# Patient Record
Sex: Male | Born: 1990 | Race: White | Hispanic: No | Marital: Single | Smoking: Current every day smoker
Health system: Southern US, Community
[De-identification: ages and names within clinical notes are randomized; demographics above are authoritative.]

## PROBLEM LIST (undated history)

## (undated) DIAGNOSIS — F329 Major depressive disorder, single episode, unspecified: Secondary | ICD-10-CM

## (undated) DIAGNOSIS — T7840XA Allergy, unspecified, initial encounter: Secondary | ICD-10-CM

## (undated) DIAGNOSIS — F32A Depression, unspecified: Secondary | ICD-10-CM

## (undated) DIAGNOSIS — F419 Anxiety disorder, unspecified: Secondary | ICD-10-CM

## (undated) HISTORY — PX: APPENDECTOMY: SHX54

## (undated) HISTORY — DX: Allergy, unspecified, initial encounter: T78.40XA

## (undated) HISTORY — PX: TONSILLECTOMY AND ADENOIDECTOMY: SUR1326

## (undated) HISTORY — PX: ADENOIDECTOMY: SHX5191

## (undated) HISTORY — DX: Anxiety disorder, unspecified: F41.9

---

## 2013-02-15 ENCOUNTER — Emergency Department (HOSPITAL_COMMUNITY): Payer: Federal, State, Local not specified - PPO

## 2013-02-15 ENCOUNTER — Encounter (HOSPITAL_COMMUNITY): Payer: Self-pay | Admitting: *Deleted

## 2013-02-15 ENCOUNTER — Emergency Department (HOSPITAL_COMMUNITY)
Admission: EM | Admit: 2013-02-15 | Discharge: 2013-02-15 | Disposition: A | Discharge status: Home / Self Care | Payer: Federal, State, Local not specified - PPO | Attending: Emergency Medicine | Admitting: Emergency Medicine

## 2013-02-15 DIAGNOSIS — Z8659 Personal history of other mental and behavioral disorders: Secondary | ICD-10-CM | POA: Insufficient documentation

## 2013-02-15 DIAGNOSIS — Z9089 Acquired absence of other organs: Secondary | ICD-10-CM | POA: Insufficient documentation

## 2013-02-15 DIAGNOSIS — R059 Cough, unspecified: Secondary | ICD-10-CM | POA: Insufficient documentation

## 2013-02-15 DIAGNOSIS — J029 Acute pharyngitis, unspecified: Secondary | ICD-10-CM

## 2013-02-15 DIAGNOSIS — R05 Cough: Secondary | ICD-10-CM | POA: Insufficient documentation

## 2013-02-15 DIAGNOSIS — F17200 Nicotine dependence, unspecified, uncomplicated: Secondary | ICD-10-CM | POA: Insufficient documentation

## 2013-02-15 DIAGNOSIS — J069 Acute upper respiratory infection, unspecified: Secondary | ICD-10-CM

## 2013-02-15 HISTORY — DX: Major depressive disorder, single episode, unspecified: F32.9

## 2013-02-15 HISTORY — DX: Depression, unspecified: F32.A

## 2013-02-15 LAB — RAPID STREP SCREEN (MED CTR MEBANE ONLY): Streptococcus, Group A Screen (Direct): NEGATIVE

## 2013-02-15 MED ORDER — ACETAMINOPHEN-CODEINE 120-12 MG/5ML PO SOLN: 10.0000 mL | ORAL | Status: DC | PRN

## 2013-02-15 MED ORDER — GUAIFENESIN ER 1200 MG PO TB12: 1.0000 | ORAL_TABLET | Freq: Two times a day (BID) | ORAL | Status: DC

## 2013-02-15 MED ORDER — IBUPROFEN 800 MG PO TABS: 800.0000 mg | ORAL_TABLET | Freq: Three times a day (TID) | ORAL | Status: DC | PRN

## 2013-02-15 NOTE — ED Notes (Signed)
Patient states that 3 days ago throat began to get very sore. Patient states he has pain with eating and drinking. He has been taking ibuprofen for pain relief but symptom persist. No coughing associated with sore throat. Patient reports sinus drainage down his throat.

## 2013-02-15 NOTE — ED Provider Notes (Signed)
CSN: [id number]     Arrival date & time 02/15/13  2027 History   First MD Initiated Contact with Patient 02/15/13 2111     Chief Complaint  Patient presents with  . Sore Throat   (Consider location/radiation/quality/duration/timing/severity/associated sxs/prior Treatment) HPI Patient presents emergency department with sore throat, cough, runny nose for the last 3 days.  Patient, states, that he's had some postnasal drip, as well.  Patient denies chest pain, shortness of breath, nausea, vomiting, abdominal pain, headache, blurred vision, weakness, numbness, fever, neck pain, back pain, dysuria, or syncope.  Patient, states, that he take ibuprofen for his discomfort.  Patient, states nothing seems to make his condition worse Past Medical History  Diagnosis Date  . Depression    Past Surgical History  Procedure Laterality Date  . Appendectomy    . Tonsillectomy and adenoidectomy Bilateral   . Adenoidectomy Bilateral    No family history on file. History  Substance Use Topics  . Smoking status: Current Every Day Smoker  . Smokeless tobacco: Not on file  . 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 All pertinent positives and negatives as reviewed in the HPI. Allergies  Aspirin and Compazine  Home Medications   Current Outpatient Rx  Name  Route  Sig  Dispense  Refill  . ibuprofen (ADVIL,MOTRIN) 200 MG tablet   Oral   Take 800 mg by mouth every 6 (six) hours as needed for pain.          BP 132/71  Pulse 96  Temp(Src) 98.5 F (36.9 C) (Oral)  Resp 18  SpO2 99% Physical Exam  Nursing note and vitals reviewed. Constitutional: He is oriented to person, place, and time. He appears well-developed and well-nourished. No distress.  HENT:  Head: Normocephalic and atraumatic.  Nose: Nose normal.  Mouth/Throat: No oropharyngeal exudate.  Eyes: Pupils are  equal, round, and reactive to light.  Neck: Normal range of motion. Neck supple.  Cardiovascular: Normal rate, regular rhythm and normal heart sounds.  Exam reveals no gallop and no friction rub.   No murmur heard. Pulmonary/Chest: Effort normal and breath sounds normal. No respiratory distress. He has no wheezes.  Neurological: He is alert and oriented to person, place, and time. He exhibits normal muscle tone. Coordination normal.  Skin: Skin is warm and dry. No erythema.    ED Course  Procedures (including critical care time) Patient be treated for viral URI symptoms told to return here as needed.  MDM      Carlyle Dolly, PA-C 02/15/13 2237

## 2013-02-16 NOTE — ED Provider Notes (Signed)
Medical screening examination/treatment/procedure(s) were performed by non-physician practitioner and as supervising physician I was immediately available for consultation/collaboration.  Lorie Melichar M Abbygail [place], MD 02/16/13 1327 

## 2013-02-17 LAB — CULTURE, GROUP A STREP

## 2013-12-29 ENCOUNTER — Encounter (HOSPITAL_COMMUNITY): Payer: Self-pay | Admitting: Emergency Medicine

## 2013-12-29 ENCOUNTER — Emergency Department (HOSPITAL_COMMUNITY)
Admission: EM | Admit: 2013-12-29 | Discharge: 2013-12-29 | Disposition: A | Discharge status: Home / Self Care | Payer: Federal, State, Local not specified - PPO | Attending: Emergency Medicine | Admitting: Emergency Medicine

## 2013-12-29 DIAGNOSIS — F3289 Other specified depressive episodes: Secondary | ICD-10-CM | POA: Insufficient documentation

## 2013-12-29 DIAGNOSIS — F32A Depression, unspecified: Secondary | ICD-10-CM

## 2013-12-29 DIAGNOSIS — F411 Generalized anxiety disorder: Secondary | ICD-10-CM | POA: Insufficient documentation

## 2013-12-29 DIAGNOSIS — F419 Anxiety disorder, unspecified: Secondary | ICD-10-CM

## 2013-12-29 DIAGNOSIS — F329 Major depressive disorder, single episode, unspecified: Secondary | ICD-10-CM | POA: Insufficient documentation

## 2013-12-29 DIAGNOSIS — F17200 Nicotine dependence, unspecified, uncomplicated: Secondary | ICD-10-CM | POA: Insufficient documentation

## 2013-12-29 DIAGNOSIS — F332 Major depressive disorder, recurrent severe without psychotic features: Secondary | ICD-10-CM

## 2013-12-29 DIAGNOSIS — R45851 Suicidal ideations: Secondary | ICD-10-CM | POA: Insufficient documentation

## 2013-12-29 DIAGNOSIS — Z79899 Other long term (current) drug therapy: Secondary | ICD-10-CM | POA: Insufficient documentation

## 2013-12-29 LAB — URINALYSIS, ROUTINE W REFLEX MICROSCOPIC
Bilirubin Urine: NEGATIVE
Glucose, UA: NEGATIVE mg/dL
Ketones, ur: NEGATIVE mg/dL
LEUKOCYTES UA: NEGATIVE
NITRITE: NEGATIVE
PH: 7.5 (ref 5.0–8.0)
Protein, ur: NEGATIVE mg/dL
SPECIFIC GRAVITY, URINE: 1.011 (ref 1.005–1.030)
UROBILINOGEN UA: 0.2 mg/dL (ref 0.0–1.0)

## 2013-12-29 LAB — CBC WITH DIFFERENTIAL/PLATELET
BASOS PCT: 0 % (ref 0–1)
Basophils Absolute: 0 10*3/uL (ref 0.0–0.1)
Eosinophils Absolute: 0.1 10*3/uL (ref 0.0–0.7)
Eosinophils Relative: 1 % (ref 0–5)
HEMATOCRIT: 47.8 % (ref 39.0–52.0)
HEMOGLOBIN: 16.7 g/dL (ref 13.0–17.0)
Lymphocytes Relative: 9 % — ABNORMAL LOW (ref 12–46)
Lymphs Abs: 1.4 10*3/uL (ref 0.7–4.0)
MCH: 31.1 pg (ref 26.0–34.0)
MCHC: 34.9 g/dL (ref 30.0–36.0)
MCV: 89 fL (ref 78.0–100.0)
MONOS PCT: 6 % (ref 3–12)
Monocytes Absolute: 0.9 10*3/uL (ref 0.1–1.0)
NEUTROS ABS: 13.1 10*3/uL — AB (ref 1.7–7.7)
Neutrophils Relative %: 84 % — ABNORMAL HIGH (ref 43–77)
Platelets: 197 10*3/uL (ref 150–400)
RBC: 5.37 MIL/uL (ref 4.22–5.81)
RDW: 12.6 % (ref 11.5–15.5)
WBC: 15.6 10*3/uL — ABNORMAL HIGH (ref 4.0–10.5)

## 2013-12-29 LAB — BASIC METABOLIC PANEL
Anion gap: 12 (ref 5–15)
BUN: 15 mg/dL (ref 6–23)
CHLORIDE: 106 meq/L (ref 96–112)
CO2: 26 mEq/L (ref 19–32)
Calcium: 10.3 mg/dL (ref 8.4–10.5)
Creatinine, Ser: 1.03 mg/dL (ref 0.50–1.35)
GFR calc Af Amer: >90 mL/min (ref >90)
GFR calc non Af Amer: >90 mL/min (ref >90)
GLUCOSE: 89 mg/dL (ref 70–99)
POTASSIUM: 4.8 meq/L (ref 3.7–5.3)
Sodium: 144 mEq/L (ref 137–147)

## 2013-12-29 LAB — ACETAMINOPHEN LEVEL

## 2013-12-29 LAB — SALICYLATE LEVEL

## 2013-12-29 LAB — RAPID URINE DRUG SCREEN, HOSP PERFORMED
Amphetamines: NOT DETECTED
BENZODIAZEPINES: NOT DETECTED
Barbiturates: NOT DETECTED
COCAINE: NOT DETECTED
OPIATES: NOT DETECTED
Tetrahydrocannabinol: POSITIVE — AB

## 2013-12-29 LAB — URINE MICROSCOPIC-ADD ON

## 2013-12-29 LAB — ETHANOL: Alcohol, Ethyl (B): <11 mg/dL (ref 0–11)

## 2013-12-29 MED ORDER — FLUOXETINE HCL 40 MG PO CAPS: 40.0000 mg | ORAL_CAPSULE | Freq: Every day | ORAL | Status: DC

## 2013-12-29 MED ORDER — ZOLPIDEM TARTRATE 5 MG PO TABS: 5.0000 mg | ORAL_TABLET | Freq: Every evening | ORAL | Status: DC | PRN

## 2013-12-29 MED ORDER — LORAZEPAM 1 MG PO TABS: 1.0000 mg | ORAL_TABLET | Freq: Three times a day (TID) | ORAL | Status: DC | PRN

## 2013-12-29 MED ORDER — NICOTINE 21 MG/24HR TD PT24
21.0000 mg | MEDICATED_PATCH | Freq: Every day | TRANSDERMAL | Status: DC
  Administered 2013-12-29: 21 mg via TRANSDERMAL
  Filled 2013-12-29: qty 1

## 2013-12-29 MED ORDER — ACETAMINOPHEN 325 MG PO TABS
650.0000 mg | ORAL_TABLET | ORAL | Status: DC | PRN
  Administered 2013-12-29: 650 mg via ORAL
  Filled 2013-12-29: qty 2

## 2013-12-29 MED ORDER — ONDANSETRON HCL 4 MG PO TABS: 4.0000 mg | ORAL_TABLET | Freq: Three times a day (TID) | ORAL | Status: DC | PRN

## 2013-12-29 MED ORDER — ALUM & MAG HYDROXIDE-SIMETH 200-200-20 MG/5ML PO SUSP: 30.0000 mL | ORAL | Status: DC | PRN
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
## 2013-12-29 NOTE — [facility] Suicide Risk Assessment (Cosign Needed)
Suicide Risk Assessment  Discharge Assessment     Demographic Factors:  Male, Adolescent or young adult, Caucasian and Low socioeconomic status  Total Time spent with patient: 30 minutes  Psychiatric Specialty Exam:     Blood pressure 120/68, pulse 74, temperature 98.3 F (36.8 C), temperature source Oral, resp. rate 18, SpO2 100.00%.There is no height or weight on file to calculate BMI.  General Appearance: Casual and Disheveled  Eye Contact::  Good  Speech:  Clear and Coherent and Normal Rate  Volume:  Normal  Mood:  Anxious and Depressed  Affect:  Congruent, Depressed and Flat  Thought Process:  Coherent, Goal Directed and Intact  Orientation:  Full (Time, Place, and Person)  Thought Content:  WDL  Suicidal Thoughts:  No  Homicidal Thoughts:  No  Memory:  Immediate;   Good Recent;   Good Remote;   Good  Judgement:  Poor  Insight:  Good  Psychomotor Activity:  Normal  Concentration:  Good  Recall:  NA  Fund of Knowledge:Good  Language: Good  Akathisia:  NA  Handed:  Right  AIMS (if indicated):     Assets:  Desire for Improvement  Sleep:       Musculoskeletal: Strength & Muscle Tone: within normal limits Gait & Station: normal Patient leans: N/A   Mental Status Per Nursing Assessment::   On Admission:     Current Mental Status by Physician: NA  Loss Factors: NA  Historical Factors: Prior suicide attempts  Risk Reduction Factors:   Employed, Living with another person, especially a relative and Positive therapeutic relationship  Continued Clinical Symptoms:  Depression:   Insomnia  Cognitive Features That Contribute To Risk:  Polarized thinking    Suicide Risk:  Minimal: No identifiable suicidal ideation.  Patients presenting with no risk factors but with morbid ruminations; may be classified as minimal risk based on the severity of the depressive symptoms  Discharge Diagnoses:   AXIS I:  Major Depression, Recurrent severe AXIS II:   Borderline Personality Dis. and Personality Disorder NOS AXIS III:   Past Medical History  Diagnosis Date  . Depression    AXIS IV:  other psychosocial or environmental problems and problems related to social environment AXIS V:  51-60 moderate symptoms  Plan Of Care/Follow-up recommendations:  Activity:  As tolerated Diet:  Regular  Is patient on multiple antipsychotic therapies at discharge:  No   Has Patient had three or more failed trials of antipsychotic monotherapy by history:  No  Recommended Plan for Multiple Antipsychotic Therapies: NA    Dahlia ByesONUOHA, Raedyn Klinck, C   PMHNP-BC 12/29/2013, 2:18 PM

## 2013-12-29 NOTE — ED Notes (Signed)
Bed: [id number] Expected date:  Expected time:  Means of arrival:  Comments: Pt is room

## 2013-12-29 NOTE — ED Notes (Signed)
Pt discharged pt per MD order. Pt explained discharge instructions. Pt denies SI HI AVH Pain. Pt encouraged to call provider for needs. Pt belongings form signed and all belongings returned. No acute distress noted.

## 2013-12-29 NOTE — ED Notes (Signed)
TTS in room.  

## 2013-12-29 NOTE — ED Notes (Signed)
Pt. To [facility] from ED ambulatory without difficulty. Report from Dollar GeneralKelly RN. Pt. Is alert and oriented, warm and dry in no distress. Pt. Denies SI, HI, and AVH. Pt. Calm and cooperative. Pt. Encouraged to let Nursing staff know if he has concerns or needs.
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
## 2013-12-29 NOTE — BH Assessment (Signed)
Consulted with Dr. Norlene Campbell before assessing the PT.  Spoke with Dr. Norlene Campbell after completing assessment about PT's disposition.
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
## 2013-12-29 NOTE — ED Notes (Signed)
Dr J and josephine NP into see 
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
## 2013-12-29 NOTE — ED Notes (Signed)
[id number] Expected date:  Expected time:  Means of arrival:  Comments: Rm 14

## 2013-12-29 NOTE — ED Notes (Signed)
Up to the desk on the phone 

## 2013-12-29 NOTE — ED Notes (Signed)
Pt has been given scrubs to change into.  

## 2013-12-29 NOTE — ED Notes (Signed)
Josephiine np into see

## 2013-12-29 NOTE — ED Notes (Signed)
Up to the bathroom 
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
## 2014-01-13 ENCOUNTER — Ambulatory Visit (INDEPENDENT_AMBULATORY_CARE_PROVIDER_SITE_OTHER): Payer: BC Managed Care – PPO | Admitting: Family Medicine

## 2014-01-13 VITALS — BP 122/76 | HR 68 | Temp 98.3°F | Resp 16 | Ht 68.75 in | Wt 154.6 lb

## 2014-01-13 DIAGNOSIS — F32A Depression, unspecified: Secondary | ICD-10-CM

## 2014-01-13 DIAGNOSIS — F3289 Other specified depressive episodes: Secondary | ICD-10-CM

## 2014-01-13 DIAGNOSIS — F411 Generalized anxiety disorder: Secondary | ICD-10-CM

## 2014-01-13 DIAGNOSIS — F329 Major depressive disorder, single episode, unspecified: Secondary | ICD-10-CM

## 2014-01-13 MED ORDER — HYDROXYZINE HCL 25 MG PO TABS: 12.5000 mg | ORAL_TABLET | Freq: Three times a day (TID) | ORAL | Status: DC | PRN

## 2014-01-13 MED ORDER — FLUOXETINE HCL 40 MG PO CAPS: 40.0000 mg | ORAL_CAPSULE | Freq: Every day | ORAL | Status: DC

## 2014-01-13 NOTE — Progress Notes (Signed)
Subjective:    Patient ID: Ralph Cruz, male    DOB: January 05, 1991, 22 y.o.   MRN: [id number]  HPI This is a pleasant 23 yo male who is accompanied by his girl firend. Patient presents today for follow up of his recent ER visit. He was seen 12/29/13 and treated for depression and anxiety. He was started on fluoxetine which he thinks has greatly reduced his depression. He is here today for refill and to discuss his anxiety. He is having 3-5 panic attacks a week, due to minor stressors. He feels like his response is not in proportion to the stress. He feels his heart race and feels anxious. These attacks last for up to 5 minutes.   He had been diagnosed with anxiety/depression several years ago and his symptoms calmed down after puberty until recent stressors triggered his anxiety and depression. He saw Lala Lund this spring for counseling until he had a problem with his insurance. He found the counseling helpful and has an appointment with her next week.  Is not sleeping well. Has insomnia off and on. Has had difficulty falling asleep, this has been better over the last several nights. 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 He does not get any exercise outside of work. He is pursuing nursing school.  Past Medical History  Diagnosis Date  . Depression   . Allergy   . Anxiety    Past Surgical History  Procedure Laterality Date  . Appendectomy    . Tonsillectomy and adenoidectomy Bilateral   . Adenoidectomy Bilateral    Family History  Problem Relation Age of Onset  . Diabetes Mother   . Mental illness Mother   . Mental illness Father   . Mental illness Maternal Grandmother   . Mental illness Maternal Grandfather    History  Substance Use Topics  . Smoking status: Current Every Day Smoker  . Smokeless tobacco: Not on file  . 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 Constitutional: He is oriented to person, place, and time. He appears well-developed and well-nourished. No distress.  HENT:  Head: Normocephalic and atraumatic.  Right Ear: Tympanic membrane, external ear and ear canal normal.  Left Ear: Tympanic membrane, external ear and ear canal normal.  Nose: Nose normal.  Mouth/Throat: Oropharynx is clear and moist. No oropharyngeal exudate.  Eyes: Conjunctivae and EOM are normal. Pupils are equal, round, and reactive to light.  Neck: Normal range of motion. Neck supple.  Cardiovascular: Normal rate, regular rhythm and normal heart sounds.   Pulmonary/Chest: Effort normal and breath sounds normal.  Musculoskeletal: Normal range of motion.  Lymphadenopathy:    He has no cervical adenopathy.  Neurological: He is alert and oriented to person, place, and time.  Skin: Skin is warm and dry. He is not diaphoretic.  Psychiatric: He has a normal mood and affect. His behavior is normal. Judgment and thought content normal.      Assessment & Plan:  1. Depression -Since fluoxetine is working well for him, will continue current dose.  - FLUoxetine (PROZAC) 40 MG capsule; Take 1 capsule (40 mg total) by mouth daily.  Dispense: 30 capsule; Refill: 2  2. Anxiety state, unspecified -Hydroxyzine 25 mg 1/2- 1  tablet every 8 hours prn anxiety  - Discussed stress reduction, sleep hygiene, avoiding self medicating with ETOH/ilicit drugs. -Follow up next week as scheduled with counselor -RTC in 8-12 weeks for follow up, sooner if worsening symptoms   Emi Belfast, FNP-BC  [facility], [facility]  01/15/2014 9:03 PM

## 2015-07-15 ENCOUNTER — Ambulatory Visit (INDEPENDENT_AMBULATORY_CARE_PROVIDER_SITE_OTHER): Payer: Commercial Managed Care - HMO | Admitting: Physician Assistant

## 2015-07-15 DIAGNOSIS — F329 Major depressive disorder, single episode, unspecified: Secondary | ICD-10-CM | POA: Diagnosis not present

## 2015-07-15 DIAGNOSIS — F411 Generalized anxiety disorder: Secondary | ICD-10-CM | POA: Diagnosis not present

## 2015-07-15 DIAGNOSIS — F32A Depression, unspecified: Secondary | ICD-10-CM

## 2015-07-15 MED ORDER — ALPRAZOLAM 0.25 MG PO TABS: 0.2500 mg | ORAL_TABLET | Freq: Every day | ORAL | Status: DC | PRN

## 2015-07-15 MED ORDER — HYDROXYZINE HCL 25 MG PO TABS: 12.5000 mg | ORAL_TABLET | Freq: Three times a day (TID) | ORAL | Status: AC | PRN

## 2015-07-15 MED ORDER — FLUOXETINE HCL 20 MG PO TABS: 20.0000 mg | ORAL_TABLET | Freq: Every day | ORAL | Status: DC

## 2015-07-15 NOTE — Patient Instructions (Addendum)
Please continue to see the counselor twice per month.  I would like to see you able to thrive without the use of the xanax, where the medicine is enough.  Take only what is prescribed.   Please return in 4-6 weeks for follow up.  We will then raise the dose of the Prozac, but it needs to get into the system first.  Alprazolam tablets What is this medicine? ALPRAZOLAM (al PRAY zoe lam) is a benzodiazepine. It is used to treat anxiety and panic attacks. This medicine may be used for other purposes; ask your health care provider or pharmacist if you have questions. What should I tell my health care provider before I take this medicine? They need to know if you have any of these conditions: -an alcohol or drug abuse problem -bipolar disorder, depression, psychosis or other mental health conditions -glaucoma -kidney or liver disease -lung or breathing disease -myasthenia gravis -Parkinson's disease -porphyria -seizures or a history of seizures -suicidal thoughts -an unusual or allergic reaction to alprazolam, other benzodiazepines, foods, dyes, or preservatives -pregnant or trying to get pregnant -breast-feeding How should I use this medicine? Take this medicine by mouth with a glass of water. Follow the directions on the prescription label. Take your medicine at regular intervals. Do not take it more often than directed. If you have been taking this medicine regularly for some time, do not suddenly stop taking it. You must gradually reduce the dose or you may get severe side effects. Ask your doctor or health care professional for advice. Even after you stop taking this medicine it can still affect your body for several days. Talk to your pediatrician regarding the use of this medicine in children. Special care may be needed. Overdosage: If you think you have taken too much of this medicine contact a poison control center or emergency room at once. NOTE: This medicine is only for you. Do not  share this medicine with others. What if I miss a dose? If you miss a dose, take it as soon as you can. If it is almost time for your next dose, take only that dose. Do not take double or extra doses. What may interact with this medicine? Do not take this medicine with any of the following medications: -certain medicines for HIV infection or AIDS -ketoconazole -itraconazole This medicine may also interact with the following medications: -birth control pills -certain macrolide antibiotics like clarithromycin, erythromycin, troleandomycin -cimetidine -cyclosporine -ergotamine -grapefruit juice -herbal or dietary supplements like kava kava, melatonin, dehydroepiandrosterone, DHEA, St. John's Wort or valerian -imatinib, STI-571 -isoniazid -levodopa -medicines for depression, anxiety, or psychotic disturbances -prescription pain medicines -rifampin, rifapentine, or rifabutin -some medicines for blood pressure or heart problems -some medicines for seizures like carbamazepine, oxcarbazepine, phenobarbital, phenytoin, primidone This list may not describe all possible interactions. Give your health care provider a list of all the medicines, herbs, non-prescription drugs, or dietary supplements you use. Also tell them if you smoke, drink alcohol, or use illegal drugs. Some items may interact with your medicine. What should I watch for while using this medicine? Visit your doctor or health care professional for regular checks on your progress. Your body can become dependent on this medicine. Ask your doctor or health care professional if you still need to take it. You may get drowsy or dizzy. Do not drive, use machinery, or do anything that needs mental alertness until you know how this medicine affects you. To reduce the risk of dizzy and fainting spells, do  not stand or sit up quickly, especially if you are an older patient. Alcohol may increase dizziness and drowsiness. Avoid alcoholic drinks. Do  not treat yourself for coughs, colds or allergies without asking your doctor or health care professional for advice. Some ingredients can increase possible side effects. What side effects may I notice from receiving this medicine? Side effects that you should report to your doctor or health care professional as soon as possible: -allergic reactions like skin rash, itching or hives, swelling of the face, lips, or tongue -confusion, forgetfulness -depression -difficulty sleeping -difficulty speaking -feeling faint or lightheaded, falls -mood changes, excitability or aggressive behavior -muscle cramps -trouble passing urine or change in the amount of urine -unusually weak or tired Side effects that usually do not require medical attention (report to your doctor or health care professional if they continue or are bothersome): -change in sex drive or performance -changes in appetite This list may not describe all possible side effects. Call your doctor for medical advice about side effects. You may report side effects to FDA at 1-800-FDA-1088. Where should I keep my medicine? Keep out of the reach of children. This medicine can be abused. Keep your medicine in a safe place to protect it from theft. Do not share this medicine with anyone. Selling or giving away this medicine is dangerous and against the law. Store at room temperature between 20 and 25 degrees C (68 and 77 degrees F). This medicine may cause accidental overdose and death if taken by other adults, children, or pets. Mix any unused medicine with a substance like cat litter or coffee grounds. Then throw the medicine away in a sealed container like a sealed bag or a coffee can with a lid. Do not use the medicine after the expiration date. NOTE: This sheet is a summary. It may not cover all possible information. If you have questions about this medicine, talk to your doctor, pharmacist, or health care provider.    2016, Elsevier/Gold  Standard. (2014-01-21 14:51:36)    Fluoxetine capsules or tablets (Depression/Mood Disorders) What is this medicine? FLUOXETINE (floo OX e teen) belongs to a class of drugs known as selective serotonin reuptake inhibitors (SSRIs). It helps to treat mood problems such as depression, obsessive compulsive disorder, and panic attacks. It can also treat certain eating disorders. This medicine may be used for other purposes; ask your health care provider or pharmacist if you have questions. What should I tell my health care provider before I take this medicine? They need to know if you have any of these conditions: -bipolar disorder or mania -diabetes -glaucoma -liver disease -psychosis -seizures -suicidal thoughts or history of attempted suicide -an unusual or allergic reaction to fluoxetine, other medicines, foods, dyes, or preservatives -pregnant or trying to get pregnant -breast-feeding How should I use this medicine? Take this medicine by mouth with a glass of water. Follow the directions on the prescription label. You can take this medicine with or without food. Take your medicine at regular intervals. Do not take it more often than directed. Do not stop taking this medicine suddenly except upon the advice of your doctor. Stopping this medicine too quickly may cause serious side effects or your condition may worsen. A special MedGuide will be given to you by the pharmacist with each prescription and refill. Be sure to read this information carefully each time. Talk to your pediatrician regarding the use of this medicine in children. While this drug may be prescribed for children as young as  7 years for selected conditions, precautions do apply. Overdosage: If you think you have taken too much of this medicine contact a poison control center or emergency room at once. NOTE: This medicine is only for you. Do not share this medicine with others. What if I miss a dose? If you miss a dose, skip  the missed dose and go back to your regular dosing schedule. Do not take double or extra doses. What may interact with this medicine? Do not take fluoxetine with any of the following medications: -other medicines containing fluoxetine, like Sarafem or Symbyax -cisapride -linezolid -MAOIs like Carbex, Eldepryl, Marplan, Nardil, and Parnate -methylene blue (injected into a vein) -pimozide -thioridazine This medicine may also interact with the following medications: -alcohol -aspirin and aspirin-like medicines -carbamazepine -certain medicines for depression, anxiety, or psychotic disturbances -certain medicines for migraine headaches like almotriptan, eletriptan, frovatriptan, naratriptan, rizatriptan, sumatriptan, zolmitriptan -digoxin -diuretics -fentanyl -flecainide -furazolidone -isoniazid -lithium -medicines for sleep -medicines that treat or prevent blood clots like warfarin, enoxaparin, and dalteparin -NSAIDs, medicines for pain and inflammation, like ibuprofen or naproxen -phenytoin -procarbazine -propafenone -rasagiline -ritonavir -supplements like St. John's wort, kava kava, valerian -tramadol -tryptophan -vinblastine This list may not describe all possible interactions. Give your health care provider a list of all the medicines, herbs, non-prescription drugs, or dietary supplements you use. Also tell them if you smoke, drink alcohol, or use illegal drugs. Some items may interact with your medicine. What should I watch for while using this medicine? Tell your doctor if your symptoms do not get better or if they get worse. Visit your doctor or health care professional for regular checks on your progress. Because it may take several weeks to see the full effects of this medicine, it is important to continue your treatment as prescribed by your doctor. Patients and their families should watch out for new or worsening thoughts of suicide or depression. Also watch out for  sudden changes in feelings such as feeling anxious, agitated, panicky, irritable, hostile, aggressive, impulsive, severely restless, overly excited and hyperactive, or not being able to sleep. If this happens, especially at the beginning of treatment or after a change in dose, call your health care professional. Bonita Quin may get drowsy or dizzy. Do not drive, use machinery, or do anything that needs mental alertness until you know how this medicine affects you. Do not stand or sit up quickly, especially if you are an older patient. This reduces the risk of dizzy or fainting spells. Alcohol may interfere with the effect of this medicine. Avoid alcoholic drinks. Your mouth may get dry. Chewing sugarless gum or sucking hard candy, and drinking plenty of water may help. Contact your doctor if the problem does not go away or is severe. This medicine may affect blood sugar levels. If you have diabetes, check with your doctor or health care professional before you change your diet or the dose of your diabetic medicine. What side effects may I notice from receiving this medicine? Side effects that you should report to your doctor or health care professional as soon as possible: -allergic reactions like skin rash, itching or hives, swelling of the face, lips, or tongue -breathing problems -confusion -eye pain, changes in vision -fast or irregular heart rate, palpitations -flu-like fever, chills, cough, muscle or joint aches and pains -seizures -suicidal thoughts or other mood changes -swelling or redness in or around the eye -tremors -trouble sleeping -unusual bleeding or bruising -unusually tired or weak -vomiting Side effects that usually do not  require medical attention (report to your doctor or health care professional if they continue or are bothersome): -change in sex drive or performance -diarrhea -dry mouth -flushing -headache -increased or decreased appetite -nausea -sweating This list may  not describe all possible side effects. Call your doctor for medical advice about side effects. You may report side effects to FDA at 1-800-FDA-1088. Where should I keep my medicine? Keep out of the reach of children. Store at room temperature between 15 and 30 degrees C (59 and 86 degrees F). Throw away any unused medicine after the expiration date. NOTE: This sheet is a summary. It may not cover all possible information. If you have questions about this medicine, talk to your doctor, pharmacist, or health care provider.    2016, Elsevier/Gold Standard. (2014-04-25 12:40:07)

## 2015-07-15 NOTE — Progress Notes (Signed)
[facility] [address], [place] [address] [phone]  Date:  07/15/2015   Name:  Ralph Cruz   DOB:  1991/03/10   MRN:  [id number]  PCP:  No PCP Per Patient    History of Present Illness:  Ralph Cruz is a 25 y.o. male patient who presents to [facility]    His insurance lapsed.      There are no active problems to display for this patient.   Past Medical History  Diagnosis Date  . Depression   . Allergy   . Anxiety     Past Surgical History  Procedure Laterality Date  . Appendectomy    . Tonsillectomy and adenoidectomy Bilateral   . Adenoidectomy Bilateral     Social History  Substance Use Topics  . Smoking status: Current Every Day Smoker  . Smokeless tobacco: None  . 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 Diabetes Mother   . Mental illness Mother   . Mental illness Father   . Mental illness Maternal Grandmother   . Mental illness Maternal Grandfather     Allergies  Allergen Reactions  . Aspirin Swelling  . Compazine [Prochlorperazine] Other (See Comments)    "Neurological"     Medication list has been reviewed and updated.  Current Outpatient Prescriptions on File Prior to Visit  Medication Sig Dispense Refill  . FLUoxetine (PROZAC) 40 MG capsule Take 1 capsule (40 mg total) by mouth daily. (Patient not taking: Reported on 07/15/2015) 30 capsule 2  . hydrOXYzine (ATARAX/VISTARIL) 25 MG tablet Take 0.5-1 tablets (12.5-25 mg total) by mouth every 8 (eight) hours as needed for anxiety. (Patient not taking: Reported on 07/15/2015) 30 tablet 1   No current facility-administered medications on file prior to visit.    ROS ROS otherwise unremarkable unless listed above.   Physical Examination: There were no vitals taken for this visit. Ideal Body Weight:    Physical Exam   Assessment and Plan: Ralph Cruz is a 25 y.o. male  who is here today   Depression - Plan: FLUoxetine (PROZAC) 20 MG tablet, hydrOXYzine (ATARAX/VISTARIL) 25 MG tablet  Generalized anxiety disorder - Plan: hydrOXYzine (ATARAX/VISTARIL) 25 MG tablet, ALPRAZolam (XANAX) 0.25 MG tablet, DISCONTINUED: ALPRAZolam Prudy Feeler) 0.25 MG tablet  Trena Platt, PA-C [facility] 3/30/20174:27 PM  Trena Platt, PA-C [facility] 07/15/2015 6:56 PM

## 2015-09-01 ENCOUNTER — Ambulatory Visit (INDEPENDENT_AMBULATORY_CARE_PROVIDER_SITE_OTHER): Payer: Commercial Managed Care - HMO | Admitting: Physician Assistant

## 2015-09-01 VITALS — BP 112/78 | HR 54 | Temp 98.0°F | Resp 16 | Ht 68.75 in | Wt 155.2 lb

## 2015-09-01 DIAGNOSIS — F32A Depression, unspecified: Secondary | ICD-10-CM

## 2015-09-01 DIAGNOSIS — F411 Generalized anxiety disorder: Secondary | ICD-10-CM | POA: Diagnosis not present

## 2015-09-01 DIAGNOSIS — F329 Major depressive disorder, single episode, unspecified: Secondary | ICD-10-CM

## 2015-09-01 MED ORDER — ALPRAZOLAM 0.25 MG PO TABS: 0.2500 mg | ORAL_TABLET | Freq: Every day | ORAL | Status: AC | PRN

## 2015-09-01 MED ORDER — FLUOXETINE HCL 20 MG PO TABS: 40.0000 mg | ORAL_TABLET | Freq: Every day | ORAL | Status: DC

## 2015-09-01 NOTE — Progress Notes (Signed)
[facility] [address], [place] [id number] [phone]  Date:  09/01/2015   Name:  Ralph Cruz   DOB:  03/31/1991   MRN:  [id number]  PCP:  No PCP Per Patient    History of Present Illness:  Ralph Cruz is a 25 y.o. male patient who [facility] to [facility] for follow up of anxiety and depression.  Patient states that he is doing much better.  He has had noticeable improvement on the fluoxetine.  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  He also reports that he had one moment, 2 weeks into start of prozac, where he felt as if he may self-harm.  He is also taking the xanax 3-4 times per week.  He reports that he does like to have it on him when he feels highly anxious.  He feels more like himself without the haze of an anti-depressant.  His girlfriend has noticed that he seems much more calm and less agitable.   He has increased the prozac over the last few weeks to 40 mg daily.  He ran out about 3 days before.  He reports side effect of nausea with the prozac initially, but this has resolved.     There are no active problems to display for this patient.   Past Medical History  Diagnosis Date  . Depression   . Allergy   . Anxiety     Past Surgical History  Procedure Laterality Date  . Appendectomy    . Tonsillectomy and adenoidectomy Bilateral   . Adenoidectomy Bilateral     Social History  Substance Use Topics  . Smoking status: Current Every Day Smoker  . Smokeless tobacco: None  . 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 Diabetes Mother   . Mental illness Mother   . Mental illness Father   . Mental illness Maternal Grandmother   . Mental illness Maternal Grandfather     Allergies  Allergen Reactions  . Aspirin Swelling  . Compazine [Prochlorperazine] Other (See Comments)    "Neurological"      Medication list has been reviewed and updated.  Current Outpatient Prescriptions on File Prior to Visit  Medication Sig Dispense Refill  . hydrOXYzine (ATARAX/VISTARIL) 25 MG tablet Take 0.5-1 tablets (12.5-25 mg total) by mouth every 8 (eight) hours as needed for anxiety. 30 tablet 1   No current facility-administered medications on file prior to visit.    ROS ROS otherwise unremarkable unless listed above.   Physical Examination: BP 112/78 mmHg  Pulse 54  Temp(Src) 98 F (36.7 C) (Oral)  Resp 16  Ht 5' 8.75" (1.746 m)  Wt 155 lb 3.2 oz (70.398 kg)  BMI 23.09 kg/m2  SpO2 98% Ideal Body Weight: Weight in (lb) to have BMI = 25: 167.7  Physical Exam  Constitutional: He is oriented to person, place, and time. He appears well-developed and well-nourished. No distress.  HENT:  Head: Normocephalic and atraumatic.  Eyes: Conjunctivae and EOM are normal. Pupils are equal, round, and reactive to light.  Cardiovascular: Normal rate.   Pulmonary/Chest: Effort normal. No respiratory distress.  Neurological: He is alert and oriented to person, place, and time.  Skin: Skin is warm and dry. He is not diaphoretic.  Psychiatric: He has a normal mood and affect. His  behavior is normal.     Assessment and Plan: Ralph Cruz is a 25 y.o. male who is here today for follow up of depression and anxiety. The prozac is improving his symptoms at this time.  We will continue the 40 mg daily.  He will be given 15 tablets.  Advised that I would like him to only use this if he is feeling high angst only--for emergency.  He voiced understanding.   If he has to stay on xanax--will likely send to psych eval for formal diagnosis.  This is not something he appears to be abusing, and he does display panic tendencies.  1. Depression - FLUoxetine (PROZAC) 20 MG tablet; Take 2 tablets (40 mg total) by mouth daily.  Dispense: 60 tablet; Refill: 2  2. Generalized anxiety disorder - ALPRAZolam (XANAX)  0.25 MG tablet; Take 1 tablet (0.25 mg total) by mouth daily as needed for anxiety.  Dispense: 15 tablet; Refill: 0   Trena Platt, PA-C [facility] 09/01/2015 6:48 PM

## 2015-09-01 NOTE — Patient Instructions (Addendum)
     IF you received an x-ray today, you will receive an invoice from [facility]. Please contact [facility] at [phone] with questions or concerns regarding your invoice.   IF you received labwork today, you will receive an invoice from [facility]. Please contact [facility] at [phone] with questions or concerns regarding your invoice.   Our billing staff will not be able to assist you with questions regarding bills from these companies.  You will be contacted with the lab results as soon as they are available. The fastest way to get your results is to activate your My Chart account. Instructions are located on the last page of this paperwork. If you have not heard from us regarding the results in 2 weeks, please contact this office.    We are refilling the prozac for 3 months.  I am giving you 15 pills of the xanax.  If you need more, you will return.

## 2015-11-27 ENCOUNTER — Other Ambulatory Visit: Payer: Self-pay | Admitting: Physician Assistant

## 2016-03-30 ENCOUNTER — Other Ambulatory Visit: Payer: Self-pay | Admitting: Physician Assistant

## 2018-11-02 ENCOUNTER — Emergency Department (HOSPITAL_COMMUNITY): Payer: 59

## 2018-11-02 ENCOUNTER — Emergency Department (HOSPITAL_COMMUNITY)
Admission: EM | Admit: 2018-11-02 | Discharge: 2018-11-02 | Disposition: A | Discharge status: Home / Self Care | Payer: 59 | Attending: Emergency Medicine | Admitting: Emergency Medicine

## 2018-11-02 ENCOUNTER — Encounter (HOSPITAL_COMMUNITY): Payer: Self-pay

## 2018-11-02 ENCOUNTER — Other Ambulatory Visit: Payer: Self-pay

## 2018-11-02 DIAGNOSIS — R112 Nausea with vomiting, unspecified: Secondary | ICD-10-CM | POA: Insufficient documentation

## 2018-11-02 DIAGNOSIS — R1013 Epigastric pain: Secondary | ICD-10-CM | POA: Diagnosis not present

## 2018-11-02 DIAGNOSIS — R1011 Right upper quadrant pain: Secondary | ICD-10-CM | POA: Insufficient documentation

## 2018-11-02 LAB — CBC WITH DIFFERENTIAL/PLATELET
Abs Immature Granulocytes: 0.02 10*3/uL (ref 0.00–0.07)
Basophils Absolute: 0 10*3/uL (ref 0.0–0.1)
Basophils Relative: 0 %
Eosinophils Absolute: 0 10*3/uL (ref 0.0–0.5)
Eosinophils Relative: 1 %
HCT: 51 % (ref 39.0–52.0)
Hemoglobin: 17.8 g/dL — ABNORMAL HIGH (ref 13.0–17.0)
Immature Granulocytes: 0 %
Lymphocytes Relative: 20 %
Lymphs Abs: 1.7 10*3/uL (ref 0.7–4.0)
MCH: 31.7 pg (ref 26.0–34.0)
MCHC: 34.9 g/dL (ref 30.0–36.0)
MCV: 90.9 fL (ref 80.0–100.0)
Monocytes Absolute: 0.7 10*3/uL (ref 0.1–1.0)
Monocytes Relative: 8 %
Neutro Abs: 6.1 10*3/uL (ref 1.7–7.7)
Neutrophils Relative %: 71 %
Platelets: 283 10*3/uL (ref 150–400)
RBC: 5.61 MIL/uL (ref 4.22–5.81)
RDW: 11.5 % (ref 11.5–15.5)
WBC: 8.5 10*3/uL (ref 4.0–10.5)
nRBC: 0 % (ref 0.0–0.2)

## 2018-11-02 LAB — URINALYSIS, ROUTINE W REFLEX MICROSCOPIC
Bilirubin Urine: NEGATIVE
Glucose, UA: NEGATIVE mg/dL
Hgb urine dipstick: NEGATIVE
Ketones, ur: 20 mg/dL — AB
Leukocytes,Ua: NEGATIVE
Nitrite: NEGATIVE
Protein, ur: 30 mg/dL — AB
Specific Gravity, Urine: 1.026 (ref 1.005–1.030)
pH: 6 (ref 5.0–8.0)

## 2018-11-02 LAB — COMPREHENSIVE METABOLIC PANEL
ALT: 20 U/L (ref 0–44)
AST: 21 U/L (ref 15–41)
Albumin: 5.1 g/dL — ABNORMAL HIGH (ref 3.5–5.0)
Alkaline Phosphatase: 74 U/L (ref 38–126)
Anion gap: 13 (ref 5–15)
BUN: 11 mg/dL (ref 6–20)
CO2: 23 mmol/L (ref 22–32)
Calcium: 10.2 mg/dL (ref 8.9–10.3)
Chloride: 106 mmol/L (ref 98–111)
Creatinine, Ser: 1 mg/dL (ref 0.61–1.24)
GFR calc Af Amer: >60 mL/min (ref >60)
GFR calc non Af Amer: >60 mL/min (ref >60)
Glucose, Bld: 100 mg/dL — ABNORMAL HIGH (ref 70–99)
Potassium: 3.8 mmol/L (ref 3.5–5.1)
Sodium: 142 mmol/L (ref 135–145)
Total Bilirubin: 1.4 mg/dL — ABNORMAL HIGH (ref 0.3–1.2)
Total Protein: 7.3 g/dL (ref 6.5–8.1)

## 2018-11-02 LAB — LIPASE, BLOOD: Lipase: 20 U/L (ref 11–51)

## 2018-11-02 MED ORDER — ALUM & MAG HYDROXIDE-SIMETH 200-200-20 MG/5ML PO SUSP
30.0000 mL | Freq: Once | ORAL | Status: AC
  Administered 2018-11-02: 30 mL via ORAL
  Filled 2018-11-02: qty 30

## 2018-11-02 MED ORDER — SUCRALFATE 1 G PO TABS: 1.0000 g | ORAL_TABLET | Freq: Three times a day (TID) | ORAL | 0 refills | Status: AC

## 2018-11-02 MED ORDER — FAMOTIDINE 20 MG PO TABS: 20.0000 mg | ORAL_TABLET | Freq: Two times a day (BID) | ORAL | 0 refills | Status: AC

## 2018-11-02 MED ORDER — LIDOCAINE VISCOUS HCL 2 % MT SOLN
15.0000 mL | Freq: Once | OROMUCOSAL | Status: AC
  Administered 2018-11-02: 15 mL via ORAL
  Filled 2018-11-02: qty 15

## 2018-11-02 MED ORDER — FENTANYL CITRATE (PF) 100 MCG/2ML IJ SOLN
50.0000 ug | Freq: Once | INTRAMUSCULAR | Status: AC
  Administered 2018-11-02: 50 ug via INTRAVENOUS
  Filled 2018-11-02: qty 2

## 2018-11-02 MED ORDER — ONDANSETRON HCL 4 MG/2ML IJ SOLN
4.0000 mg | Freq: Once | INTRAMUSCULAR | Status: AC
  Administered 2018-11-02: 4 mg via INTRAVENOUS
  Filled 2018-11-02: qty 2

## 2018-11-02 MED ORDER — ONDANSETRON 4 MG PO TBDP: 4.0000 mg | ORAL_TABLET | Freq: Three times a day (TID) | ORAL | 0 refills | Status: DC | PRN

## 2018-11-02 MED ORDER — MORPHINE SULFATE (PF) 4 MG/ML IV SOLN
4.0000 mg | Freq: Once | INTRAVENOUS | Status: DC
  Filled 2018-11-02: qty 1

## 2018-11-02 MED ORDER — IOHEXOL 300 MG/ML  SOLN
100.0000 mL | Freq: Once | INTRAMUSCULAR | Status: AC | PRN
  Administered 2018-11-02: 100 mL via INTRAVENOUS

## 2018-11-02 MED ORDER — SODIUM CHLORIDE 0.9 % IV BOLUS
1000.0000 mL | Freq: Once | INTRAVENOUS | Status: AC
  Administered 2018-11-02: 15:00:00 1000 mL via INTRAVENOUS

## 2018-11-02 NOTE — ED Triage Notes (Signed)
Pt presents for abdominal pain, NV x3 days. Pt states pain significantly worsened at 0100 this am. Pt endorses poor appetite x3 days as well, endorses 10-12 episodes of vomiting. Pt denies fever/chills. Pt denies sick contacts.

## 2018-11-02 NOTE — ED Notes (Signed)
Patient transported to Ultrasound 

## 2018-11-02 NOTE — ED Provider Notes (Signed)
Received patient at signout from [facility].  Refer to provider note for full history and physical examination.  Briefly, patient is a 28 year old male presenting for evaluation of right upper quadrant epigastric pain for 3 days.  Has had some nausea and vomiting.  Has a history of appendectomy.  Pending CT scan and ultrasound.   Physical Exam  BP 130/73   Pulse 91   Temp 98.4 F (36.9 C) (Oral)   Resp 17   Ht 5\' 9"  (1.753 m)   Wt 68 kg   SpO2 95%   BMI 22.15 kg/m   Physical Exam Vitals signs and nursing note reviewed.  Constitutional:      General: He is not in acute distress.    Appearance: He is well-developed.     Comments: Resting comfortably in chair  HENT:     Head: Normocephalic and atraumatic.  Eyes:     General:        Right eye: No discharge.        Left eye: No discharge.     Conjunctiva/sclera: Conjunctivae normal.  Neck:     Vascular: No JVD.     Trachea: No tracheal deviation.  Cardiovascular:     Rate and Rhythm: Normal rate.  Pulmonary:     Effort: Pulmonary effort is normal.  Abdominal:     General: There is no distension.     Palpations: Abdomen is soft.     Tenderness: There is no guarding or rebound.  Skin:    Findings: No erythema.  Neurological:     Mental Status: He is alert.  Psychiatric:        Behavior: Behavior normal.     ED Course/Procedures      Procedures  MDM  Lab work reviewed by me shows no leukocytosis though I suspect the patient is dehydrated as his hemoglobin is elevated and he has ketones in his urine.  No evidence of UTI or nephrolithiasis on UA.  Lipase, creatinine, and AST and ALT are within normal limits.  His total bilirubin is mildly elevated though I am unsure if this is clinically significant today.  His imaging shows no evidence of acute surgical abdominal pathology.  No evidence of cholecystitis or hepatobiliary abnormality.  He does have some bilateral renal calculi though I do not think these are contributing to  his symptoms today.  On my evaluation he is resting comfortably no apparent distress.  He reports he is feeling better.  His abdomen shows no peritoneal signs on exam.  He is tolerating sips of fluid without difficulty.  Will discharge with symptomatic management.  Recommend follow-up with PCP or gastroenterologist for reevaluation of symptoms.  Discussed strict ED return precautions. Patient verbalized understanding of and agreement with plan and is safe for discharge home at this time.       Renita Papa, PA-C 11/02/18 1914    Drenda Freeze, MD 11/04/18 2031

## 2018-11-02 NOTE — Discharge Instructions (Signed)
1. Medications: Take Zofran as needed for nausea.  Let this medicine dissolve under your tongue and wait around 20 minutes before eating or drinking after taking this medication.  Start taking Pepcid twice daily.  You can take Carafate with meals and at night. 2. Treatment: rest, drink plenty of fluids, advance diet slowly.  Avoid fried foods, spicy foods, acidic foods, fatty foods, or alcohol. 3. Follow Up: Please followup with your primary doctor or a gastroenterologist in 3-5 days for discussion of your diagnoses and further evaluation after today's visit; if you do not have a primary care doctor use the resource guide provided to find one; Please return to the ER for persistent vomiting, high fevers or worsening symptoms

## 2018-11-02 NOTE — ED Notes (Signed)
Back from scan. 

## 2018-11-02 NOTE — ED Notes (Signed)
Pt verbalized understanding of discharge and follow up instructions. IV removed and bleeding controlled. Pt ambulated to restroom with steady gait.

## 2018-11-02 NOTE — ED Provider Notes (Signed)
[facility]   CSN: [id number] Arrival date & time: 11/02/18  1352     History   Chief Complaint Chief Complaint  Patient presents with  . Abdominal Pain  . Emesis    HPI Ralph Cruz is a 28 y.o. male presents today stating epigastric abd pain that started 3 days ago.   Pt states pain is sharp/stabbing, intermittent but now becoming constant and worsening in intensity.   Pain is non-radiating.   Pt reports decreased appetite over the past 24 hours and n/v with liquids in the past 14 hours.   He denies fever, cp, shob, cough/congestion, diarrhea, urinary symptoms or sick contacts.   He has PMH of depression and takes Prozac.   Surgical history is appendectomy.        HPI  Past Medical History:  Diagnosis Date  . Allergy   . Anxiety   . Depression     There are no active problems to display for this patient.   Past Surgical History:  Procedure Laterality Date  . ADENOIDECTOMY Bilateral   . APPENDECTOMY    . TONSILLECTOMY AND ADENOIDECTOMY Bilateral         Home Medications    Prior to Admission medications   Medication Sig Start Date End Date Taking? Authorizing Provider  ALPRAZolam (XANAX) 0.25 MG tablet Take 1 tablet (0.25 mg total) by mouth daily as needed for anxiety. 09/01/15   Trena PlattEnglish, Stephanie D, PA  FLUoxetine (PROZAC) 20 MG tablet TAKE 2 TABLETS (40 MG TOTAL) BY MOUTH DAILY. 12/01/15   Trena PlattEnglish, Stephanie D, PA  hydrOXYzine (ATARAX/VISTARIL) 25 MG tablet Take 0.5-1 tablets (12.5-25 mg total) by mouth every 8 (eight) hours as needed for anxiety. 07/15/15   Garnetta BuddyEnglish, Stephanie D, PA    Family History Family History  Problem Relation Age of Onset  . Diabetes Mother   . Mental illness Mother   . Mental illness Father   . Mental illness Maternal Grandmother   . Mental illness Maternal Grandfather     Social History Social History   Tobacco Use  . Smoking status: Current Every Day Smoker  Substance  Use Topics  . 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 Drug use: No     Allergies   Aspirin and Prochlorperazine   Review of Systems Review of Systems  Constitutional: Positive for appetite change. Negative for chills and fever.  Respiratory: Negative for cough, chest tightness and shortness of breath.   Cardiovascular: Negative for chest pain.  Gastrointestinal: Positive for abdominal pain, nausea and vomiting. Negative for diarrhea.  Genitourinary: Negative for decreased urine volume, dysuria, frequency and testicular pain.  Musculoskeletal: Negative for myalgias.  Skin: Negative for color change.  Neurological: Negative for dizziness and headaches.     Physical Exam Updated Vital Signs BP 121/81 (BP Location: Right Arm)   Pulse 78   Temp 98.4 F (36.9 C) (Oral)   Resp 17   Ht 5\' 9"  (1.753 m)   Wt 68 kg   SpO2 98%   BMI 22.15 kg/m   Physical Exam Vitals signs and nursing note reviewed.  Constitutional:      General: He is in acute distress (appears slightly uncomfortable).     Appearance: Normal appearance. He is not diaphoretic.  HENT:     Head: Normocephalic and atraumatic.  Eyes:     General:        Right  eye: No discharge.        Left eye: No discharge.  Cardiovascular:     Rate and Rhythm: Normal rate and regular rhythm.     Heart sounds: Normal heart sounds.  Pulmonary:     Effort: Pulmonary effort is normal. No respiratory distress.     Breath sounds: Normal breath sounds. No wheezing or rales.  Abdominal:     General: Abdomen is flat. A surgical scar is present. Bowel sounds are increased. There is no distension.     Palpations: There is no hepatomegaly or splenomegaly.     Tenderness: There is abdominal tenderness in the right upper quadrant and epigastric area. There is guarding. There is no rebound.  Skin:    General: Skin is warm.     Capillary Refill: Capillary refill takes less than 2 seconds.   Neurological:     General: No focal deficit present.     Mental Status: He is alert and oriented to person, place, and time.     Coordination: Coordination normal.  Psychiatric:        Mood and Affect: Mood normal.        Behavior: Behavior normal.      ED Treatments / Results  Labs (all labs ordered are listed, but only abnormal results are displayed) Labs Reviewed  COMPREHENSIVE METABOLIC PANEL  LIPASE, BLOOD  CBC WITH DIFFERENTIAL/PLATELET  URINALYSIS, ROUTINE W REFLEX MICROSCOPIC    EKG    Radiology No results found.  Procedures Procedures (including critical care time)  Medications Ordered in ED Medications  sodium chloride 0.9 % bolus 1,000 mL (1,000 mLs Intravenous New Bag/Given 11/02/18 1436)  ondansetron (ZOFRAN) injection 4 mg (4 mg Intravenous Given 11/02/18 1434)  fentaNYL (SUBLIMAZE) injection 50 mcg (50 mcg Intravenous Given 11/02/18 1445)     Initial Impression / Assessment and Plan / ED Course  I have reviewed the triage vital signs and the nursing notes.  Pertinent labs & imaging results that were available during my care of the patient were reviewed by me and considered in my medical decision making (see chart for details).  Pt care endorsed to Winfield Cunas PA-C pending lab and imaging results  Final Clinical Impressions(s) / ED Diagnoses   Final diagnoses:  RUQ pain    ED Discharge Orders    None       Aikeem Lilley K, PA-C 11/02/18 1603    Drenda Freeze, MD 11/02/18 [phone]

## 2020-12-01 ENCOUNTER — Emergency Department (HOSPITAL_COMMUNITY)
Admission: EM | Admit: 2020-12-01 | Discharge: 2020-12-01 | Disposition: A | Discharge status: Home / Self Care | Payer: 59 | Attending: Emergency Medicine | Admitting: Emergency Medicine

## 2020-12-01 ENCOUNTER — Emergency Department (HOSPITAL_COMMUNITY): Payer: 59

## 2020-12-01 DIAGNOSIS — Z20822 Contact with and (suspected) exposure to covid-19: Secondary | ICD-10-CM | POA: Insufficient documentation

## 2020-12-01 DIAGNOSIS — J189 Pneumonia, unspecified organism: Secondary | ICD-10-CM

## 2020-12-01 DIAGNOSIS — J188 Other pneumonia, unspecified organism: Secondary | ICD-10-CM | POA: Diagnosis not present

## 2020-12-01 DIAGNOSIS — R059 Cough, unspecified: Secondary | ICD-10-CM | POA: Diagnosis present

## 2020-12-01 LAB — CBC WITH DIFFERENTIAL/PLATELET
Abs Immature Granulocytes: 0.16 10*3/uL — ABNORMAL HIGH (ref 0.00–0.07)
Basophils Absolute: 0.1 10*3/uL (ref 0.0–0.1)
Basophils Relative: 0 %
Eosinophils Absolute: 0 10*3/uL (ref 0.0–0.5)
Eosinophils Relative: 0 %
HCT: 47.4 % (ref 39.0–52.0)
Hemoglobin: 16.5 g/dL (ref 13.0–17.0)
Immature Granulocytes: 1 %
Lymphocytes Relative: 5 %
Lymphs Abs: 1 10*3/uL (ref 0.7–4.0)
MCH: 31.2 pg (ref 26.0–34.0)
MCHC: 34.8 g/dL (ref 30.0–36.0)
MCV: 89.6 fL (ref 80.0–100.0)
Monocytes Absolute: 1.6 10*3/uL — ABNORMAL HIGH (ref 0.1–1.0)
Monocytes Relative: 8 %
Neutro Abs: 17.1 10*3/uL — ABNORMAL HIGH (ref 1.7–7.7)
Neutrophils Relative %: 86 %
Platelets: 237 10*3/uL (ref 150–400)
RBC: 5.29 MIL/uL (ref 4.22–5.81)
RDW: 11.8 % (ref 11.5–15.5)
WBC: 19.9 10*3/uL — ABNORMAL HIGH (ref 4.0–10.5)
nRBC: 0 % (ref 0.0–0.2)

## 2020-12-01 LAB — LIPASE, BLOOD: Lipase: 22 U/L (ref 11–51)

## 2020-12-01 LAB — RESP PANEL BY RT-PCR (FLU A&B, COVID) ARPGX2
Influenza A by PCR: NEGATIVE
Influenza B by PCR: NEGATIVE
SARS Coronavirus 2 by RT PCR: NEGATIVE

## 2020-12-01 LAB — COMPREHENSIVE METABOLIC PANEL
ALT: 25 U/L (ref 0–44)
AST: 15 U/L (ref 15–41)
Albumin: 4.6 g/dL (ref 3.5–5.0)
Alkaline Phosphatase: 102 U/L (ref 38–126)
Anion gap: 12 (ref 5–15)
BUN: 11 mg/dL (ref 6–20)
CO2: 24 mmol/L (ref 22–32)
Calcium: 9.7 mg/dL (ref 8.9–10.3)
Chloride: 101 mmol/L (ref 98–111)
Creatinine, Ser: 1.13 mg/dL (ref 0.61–1.24)
GFR, Estimated: >60 mL/min (ref >60)
Glucose, Bld: 115 mg/dL — ABNORMAL HIGH (ref 70–99)
Potassium: 4.1 mmol/L (ref 3.5–5.1)
Sodium: 137 mmol/L (ref 135–145)
Total Bilirubin: 1.9 mg/dL — ABNORMAL HIGH (ref 0.3–1.2)
Total Protein: 8.2 g/dL — ABNORMAL HIGH (ref 6.5–8.1)

## 2020-12-01 LAB — TROPONIN I (HIGH SENSITIVITY): Troponin I (High Sensitivity): 3 ng/L (ref <18)

## 2020-12-01 MED ORDER — AZITHROMYCIN 250 MG PO TABS: 250.0000 mg | ORAL_TABLET | Freq: Every day | ORAL | 0 refills | Status: AC

## 2020-12-01 MED ORDER — ONDANSETRON 4 MG PO TBDP: 4.0000 mg | ORAL_TABLET | Freq: Once | ORAL | Status: DC

## 2020-12-01 MED ORDER — ACETAMINOPHEN 325 MG PO TABS
650.0000 mg | ORAL_TABLET | Freq: Once | ORAL | Status: AC
  Administered 2020-12-01: 650 mg via ORAL
  Filled 2020-12-01: qty 2

## 2020-12-01 MED ORDER — AMOXICILLIN 500 MG PO TABS: 1000.0000 mg | ORAL_TABLET | Freq: Two times a day (BID) | ORAL | 0 refills | Status: AC

## 2020-12-01 MED ORDER — AZITHROMYCIN 250 MG PO TABS
500.0000 mg | ORAL_TABLET | Freq: Once | ORAL | Status: AC
  Administered 2020-12-01: 500 mg via ORAL
  Filled 2020-12-01: qty 2

## 2020-12-01 MED ORDER — ONDANSETRON 4 MG PO TBDP: ORAL_TABLET | ORAL | 0 refills | Status: AC

## 2020-12-01 MED ORDER — AMOXICILLIN 500 MG PO CAPS
1000.0000 mg | ORAL_CAPSULE | Freq: Once | ORAL | Status: AC
  Administered 2020-12-01: 1000 mg via ORAL
  Filled 2020-12-01: qty 2

## 2020-12-01 MED ORDER — ONDANSETRON HCL 4 MG/2ML IJ SOLN
4.0000 mg | Freq: Once | INTRAMUSCULAR | Status: AC
  Administered 2020-12-01: 4 mg via INTRAVENOUS
  Filled 2020-12-01: qty 2

## 2020-12-01 MED ORDER — SODIUM CHLORIDE 0.9 % IV BOLUS
1000.0000 mL | Freq: Once | INTRAVENOUS | Status: AC
  Administered 2020-12-01: 1000 mL via INTRAVENOUS

## 2020-12-01 NOTE — ED Provider Notes (Signed)
[facility] EMERGENCY DEPARTMENT Provider Note   CSN: [id number] Arrival date & time: 12/01/20  1401     History Chief Complaint  Patient presents with   URI    Ralph Cruz is a 30 y.o. male.  30 yO M with a chief complaints of productive cough going on for about 11 days now.  His mom was sick when he went to visit her about a week ago but she is gotten better.  He has developed nausea vomiting and diarrhea in the past 24 hours.  Subjective fevers and chills.  No other known sick contacts.  Denies prior significant medical history.  The history is provided by the patient.  URI Presenting symptoms: cough and fever   Presenting symptoms: no congestion   Severity:  Moderate Onset quality:  Gradual Duration:  11 days Timing:  Constant Progression:  Worsening Chronicity:  New Relieved by:  Nothing Worsened by:  Nothing Associated symptoms: no arthralgias, no headaches and no myalgias       Past Medical History:  Diagnosis Date   Allergy    Anxiety    Depression     There are no problems to display for this patient.   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 Authorizing Provider  amoxicillin (AMOXIL) 500 MG tablet Take 2 tablets (1,000 mg total) by mouth 2 (two) times daily. 12/01/20  Yes Melene Plan, DO  azithromycin (ZITHROMAX) 250 MG tablet Take 1 tablet (250 mg  total) by mouth daily for 4 days. 12/01/20 12/05/20 Yes Melene Plan, DO  ondansetron (ZOFRAN ODT) 4 MG disintegrating tablet 4mg  ODT q4 hours prn nausea/vomit 12/01/20  Yes 12/03/20, DO  ALPRAZolam Melene Plan) 0.25 MG tablet Take 1 tablet (0.25 mg total) by mouth daily as needed for anxiety. Patient not taking: Reported on 11/02/2018 09/01/15   09/03/15 D, PA  famotidine (PEPCID) 20 MG tablet Take 1 tablet (20 mg total) by mouth 2 (two) times daily. 11/02/18   Fawze, Mina A, PA-C  FLONASE 50 MCG/ACT nasal spray Place 2 sprays into both nostrils daily as needed for allergies or rhinitis.  06/28/18   [provider]  FLUoxetine (PROZAC) 20 MG capsule Take 20 mg by mouth daily.    [provider]  FLUoxetine (PROZAC) 20 MG tablet TAKE 2 TABLETS (40 MG TOTAL) BY MOUTH DAILY. Patient not taking: No sig reported 12/01/15   12/03/15 D, PA  hydrOXYzine (ATARAX/VISTARIL) 25 MG tablet Take 0.5-1 tablets (12.5-25 mg total) by mouth every 8 (eight) hours as needed for anxiety. Patient not taking: Reported on 11/02/2018 07/15/15   09/14/15 D, PA  sucralfate (CARAFATE) 1 g tablet Take 1 tablet (1 g total) by mouth 4 (four) times daily -  with meals and at bedtime.  11/02/18   Fawze, Mina A, PA-C  WELLBUTRIN XL 150 MG 24 hr tablet Take 150 mg by mouth daily. 10/02/18   [provider]    Allergies    Aspirin, Prochlorperazine, and Wellbutrin [bupropion]  Review of Systems   Review of Systems  Constitutional:  Positive for fever. Negative for chills.  HENT:  Negative for congestion and facial swelling.   Eyes:  Negative for discharge and visual disturbance.  Respiratory:  Positive for cough. Negative for shortness of breath.   Cardiovascular:  Negative for chest pain and palpitations.  Gastrointestinal:  Negative for abdominal pain, diarrhea and vomiting.  Musculoskeletal:  Negative for arthralgias and myalgias.  Skin:  Negative for color change and rash.   Neurological:  Negative for tremors, syncope and headaches.  Psychiatric/Behavioral:  Negative for confusion and dysphoric mood.    Physical Exam Updated Vital Signs BP 138/80 (BP Location: Left Arm)   Pulse 82   Temp 98.4 F (36.9 C) (Oral)   Resp 19   SpO2 97%   Physical Exam Vitals and nursing note reviewed.  Constitutional:      Appearance: He is well-developed.  HENT:     Head: Normocephalic and atraumatic.  Eyes:     Pupils: Pupils are equal, round, and reactive to light.  Neck:     Vascular: No JVD.  Cardiovascular:     Rate and Rhythm: Normal rate and regular rhythm.     Heart sounds: No murmur heard.   No friction rub. No gallop.  Pulmonary:     Effort: No respiratory distress.     Breath sounds: Rhonchi present. No wheezing.     Comments: Diffuse rhonchi versus transmitted upper airway noises. Abdominal:     General: There is no distension.     Tenderness: There is no abdominal tenderness. There is no guarding or rebound.  Musculoskeletal:        General: Normal range of motion.     Cervical back: Normal range of motion and neck supple.  Skin:    Coloration: Skin is not pale.     Findings: No rash.  Neurological:     Mental Status: He is alert and oriented to person, place, and time.  Psychiatric:        Behavior: Behavior normal.    ED Results / Procedures / Treatments   Labs (all labs ordered are listed, but only abnormal results are displayed) Labs Reviewed  COMPREHENSIVE METABOLIC PANEL - Abnormal; Notable for the following components:      Result Value   Glucose, Bld 115 (*)    Total Protein 8.2 (*)    Total Bilirubin 1.9 (*)    All other components within normal limits  CBC WITH DIFFERENTIAL/PLATELET - Abnormal; Notable for the following components:   WBC 19.9 (*)    Neutro Abs 17.1 (*)    Monocytes Absolute 1.6 (*)    Abs Immature Granulocytes 0.16 (*)    All other components within normal limits  RESP PANEL BY RT-PCR (FLU A&B, COVID)  ARPGX2  LIPASE, BLOOD  TROPONIN I (HIGH SENSITIVITY)    EKG EKG Interpretation  Date/Time:  Tuesday December 01 2020 15:05:39 EDT Ventricular Rate:  93 PR Interval:  158 QRS Duration: 102 QT Interval:  352 QTC Calculation: 437 R Axis:   94 Text Interpretation: Normal sinus rhythm Right atrial enlargement Rightward axis Pulmonary disease pattern Abnormal ECG No old tracing to compare Confirmed by Melene Plan [phone]) on 12/01/2020 5:53:15 PM  Radiology DG  Chest 2 View  Result Date: 12/01/2020 CLINICAL DATA:  Shortness of breath.  Cough fever. EXAM: CHEST - 2 VIEW COMPARISON:  Chest radiograph 02/15/2013. Lung bases from abdominal CT 11/02/2018 FINDINGS: The heart is normal in size. Normal mediastinal contours. There is patchy airspace disease within the lingula. Chronic blunting of the costophrenic angle may be related to scarring, unchanged from prior exams. No significant effusion. No pulmonary edema. No pneumothorax. No acute osseous abnormalities are seen. IMPRESSION: Patchy airspace disease within the lingula, suspicious for pneumonia. Electronically Signed   By: Narda RutherfordMelanie  Sanford M.D.   On: 12/01/2020 16:32    Procedures Procedures   Medications Ordered in ED Medications  ondansetron (ZOFRAN-ODT) disintegrating tablet 4 mg (4 mg Oral Not Given 12/01/20 1817)  acetaminophen (TYLENOL) tablet 650 mg (650 mg Oral Given 12/01/20 1814)  sodium chloride 0.9 % bolus 1,000 mL (1,000 mLs Intravenous New Bag/Given 12/01/20 1817)  ondansetron (ZOFRAN) injection 4 mg (4 mg Intravenous Given 12/01/20 1813)  amoxicillin (AMOXIL) capsule 1,000 mg (1,000 mg Oral Given 12/01/20 1815)  azithromycin (ZITHROMAX) tablet 500 mg (500 mg Oral Given 12/01/20 1815)    ED Course  I have reviewed the triage vital signs and the nursing notes.  Pertinent labs & imaging results that were available during my care of the patient were reviewed by me and considered in my medical decision making (see chart for details).     MDM Rules/Calculators/A&P                           30 yo M with a chief complaints of productive cough going on for a little over a week and a half.  Was seen in the quick look process and had blood work and a chest x-ray performed.  Chest x-ray is concerning for a lingular pneumonia.  Will start on antibiotics.  He has been having nausea and vomiting for the past 24 hours and has not been able to tolerate oral intake.  Given a dose of oral Zofran upfront but still with persistent nausea and vomiting.  We will give a bolus of IV fluids and IV Zofran and reassess.  Patient tolerating PO.  Covid negative.  D/c home.   7:30 PM:  I have discussed the diagnosis/risks/treatment options with the patient and believe the pt to be eligible for discharge home to follow-up with PCP. We also discussed returning to the ED immediately if new or worsening sx occur. We discussed the sx which are most concerning (e.g., sudden worsening pain, fever, inability to tolerate by mouth) that necessitate immediate return. Medications administered to the patient during their visit and any new prescriptions provided to the patient are listed below.  Medications given during this visit Medications  ondansetron (ZOFRAN-ODT) disintegrating tablet 4 mg (4 mg Oral Not Given 12/01/20 1817)  acetaminophen (TYLENOL) tablet 650 mg (650 mg Oral Given 12/01/20 1814)  sodium chloride 0.9 % bolus 1,000 mL (1,000 mLs Intravenous New Bag/Given 12/01/20 1817)  ondansetron (ZOFRAN) injection 4 mg (4 mg Intravenous Given 12/01/20 1813)  amoxicillin (AMOXIL) capsule 1,000 mg (1,000 mg Oral Given 12/01/20 1815)  azithromycin (ZITHROMAX) tablet 500 mg (500 mg Oral Given 12/01/20 1815)     The patient appears reasonably screen and/or stabilized for discharge and I doubt any other medical condition or other [facility] requiring further screening, evaluation, or treatment in the ED at this time prior to discharge.   Final Clinical Impression(s) / ED  Diagnoses Final diagnoses:  Lingular pneumonia    Rx / DC Orders ED Discharge Orders          Ordered    amoxicillin (AMOXIL) 500 MG tablet  2 times daily        12/01/20 1928    azithromycin (ZITHROMAX) 250 MG tablet  Daily        12/01/20 1928    ondansetron (ZOFRAN ODT) 4 MG disintegrating tablet        12/01/20 1928             Melene Plan, DO 12/01/20 1930

## 2020-12-01 NOTE — ED Notes (Signed)
Pt vaccinated but not boosted

## 2020-12-01 NOTE — ED Triage Notes (Signed)
Pt c/o URI that began last Sat, "keeps getting worse." Pt endorses increased productive cough, fever x2 days, SHOB, CP associated. This AM emesis x2, diarrhea x1. Decreased appetite

## 2020-12-01 NOTE — Discharge Instructions (Addendum)
Take tylenol 2 pills 4 times a day and motrin 4 pills 3 times a day.  Drink plenty of fluids.  Return for worsening shortness of breath, headache, confusion. Follow up with your family doctor.   Take the Zofran for nausea, you can take Imodium for diarrhea.  Please return for inability to eat or drink.

## 2020-12-01 NOTE — ED Provider Notes (Signed)
Emergency Medicine Provider Triage Evaluation Note  Ralph Cruz , a 30 y.o. male  was evaluated in triage.  Pt complains of 11 days of cough, productive with clear sputum, fever, shortness of breath, denies chest pain.  2 days of nausea, NBNB emesis, and 1 episode of watery diarrhea without melena or hematochezia.  Not tolerating p.o. due to nausea.  Is vaccinated against COVID-19.Marland Kitchen  Review of Systems  Positive: Fevers T-max 102 F at home today.  Shortness of breath, chest pain, cough nausea, vomiting, diarrhea Negative: Palpitations blurry, double vision  Physical Exam  BP (!) 144/84   Pulse 86   Temp 98.4 F (36.9 C) (Oral)   Resp (!) 22   SpO2 95%  Gen:   Awake, no distress, ill appearing  Resp:  Normal effort  MSK:   Moves extremities without difficulty  Other:  RRR no M/R/G.  Lungs with expiratory wheezing and rales  Medical Decision Making  Medically screening exam initiated at 3:52 PM.  Appropriate orders placed.  Ralph Cruz was informed that the remainder of the evaluation will be completed by another provider, this initial triage assessment does not replace that evaluation, and the importance of remaining in the ED until their evaluation is complete.  This chart was dictated using voice recognition software, Dragon. Despite the best efforts of this provider to proofread and correct errors, errors may still occur which can change documentation meaning.    Sherrilee Gilles 12/01/20 1553    Bethann Berkshire, MD 12/03/20 1019

## 2021-01-28 IMAGING — CT CT ABDOMEN AND PELVIS WITH CONTRAST
2 of 4 series · 17 of 46 positions shown, 19 images · IV contrast (omnipaque)
Comparison: Abdominal ultrasound 11/02/2018

CLINICAL DATA: Abdominal pain. Nausea vomiting for 3 days.

EXAM:
CT ABDOMEN AND PELVIS WITH CONTRAST
TECHNIQUE: Multidetector CT imaging of the abdomen and pelvis was performed
using the standard protocol following bolus administration of
intravenous contrast.
CONTRAST:  100mL OMNIPAQUE IOHEXOL 300 MG/ML  SOLN

[Series 3: abd/ pelvis 5.0 i30f 2 · axial · 0.78mm/px · z∈[+574,+989]mm · 14 of 91 slices shown, 16 images]
[im 4/91  soft-tissue]
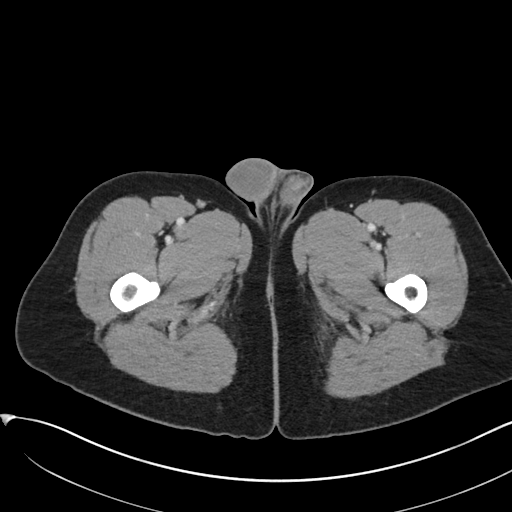
[im 4/91  bone]
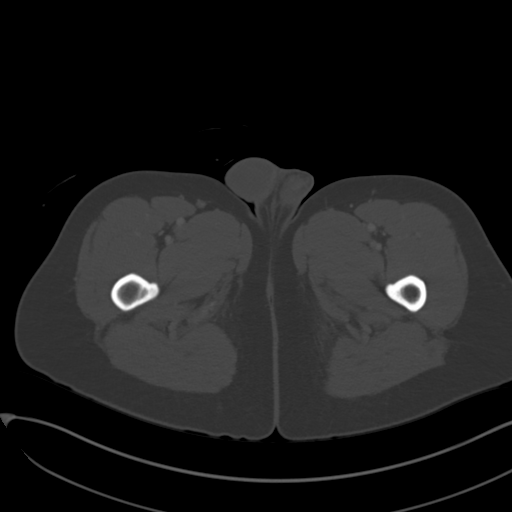
[im 12/91  soft-tissue]
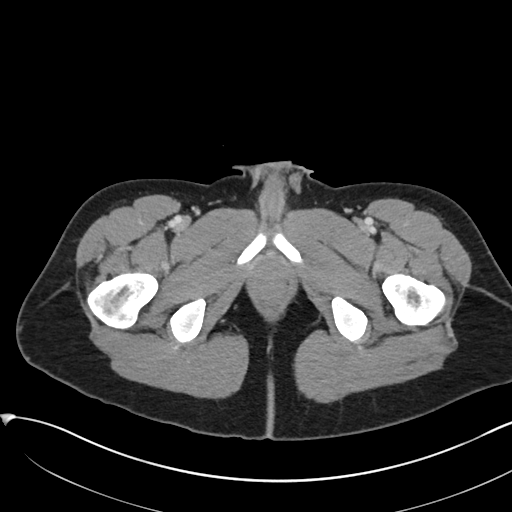
[im 19/91  soft-tissue]
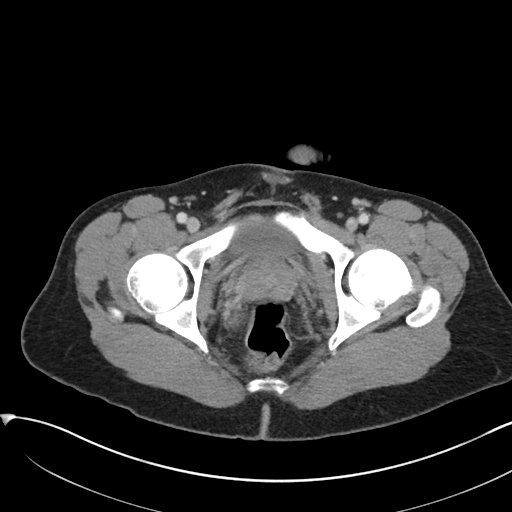
[im 23/91  soft-tissue]
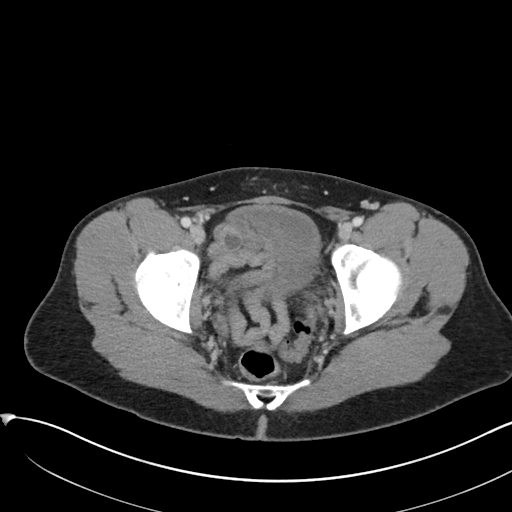
[im 31/91  soft-tissue]
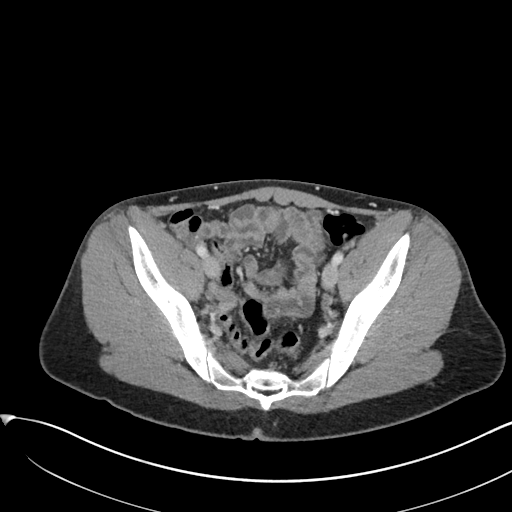
[im 38/91  soft-tissue]
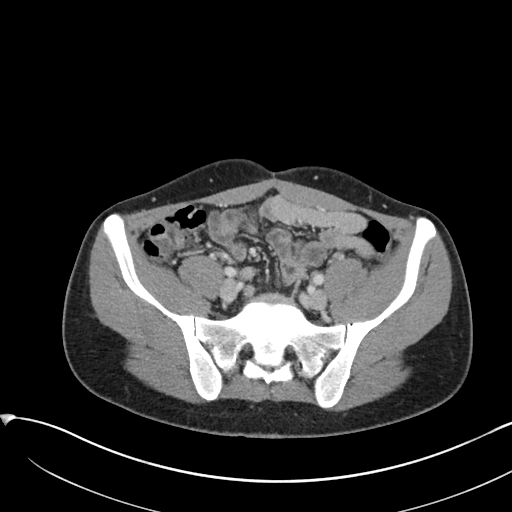
[im 42/91  soft-tissue]
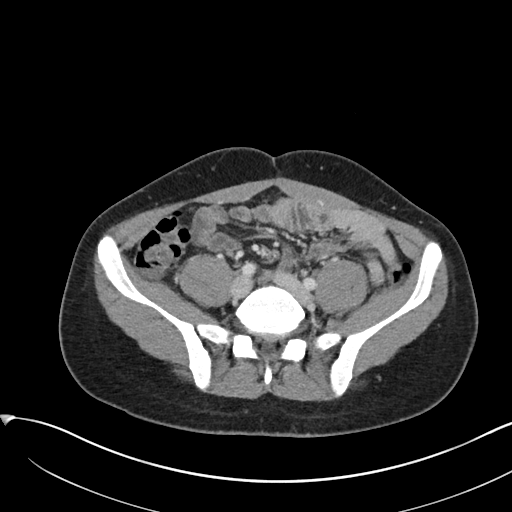
[im 49/91  soft-tissue]
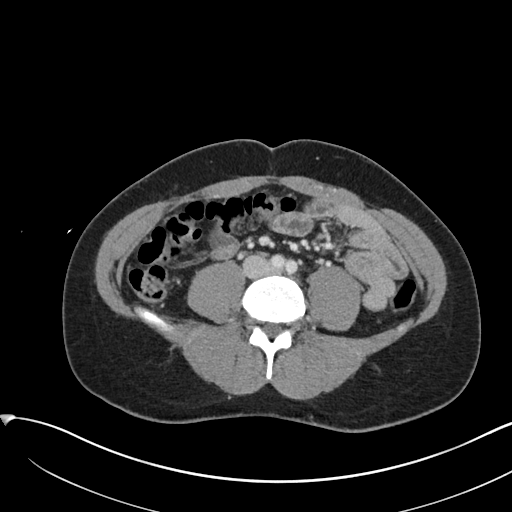
[im 53/91  soft-tissue]
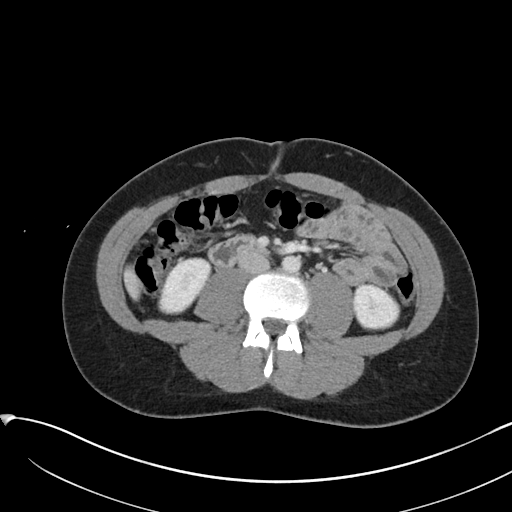
[im 53/91  bone]
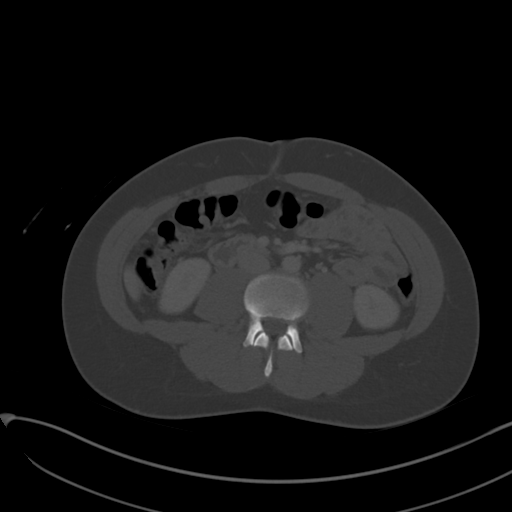
[im 61/91  soft-tissue]
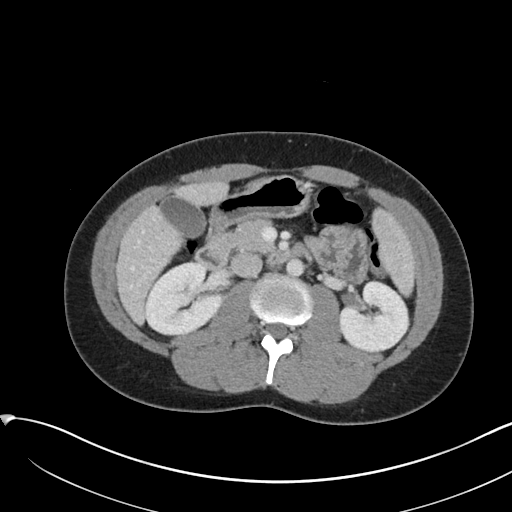
[im 68/91  soft-tissue]
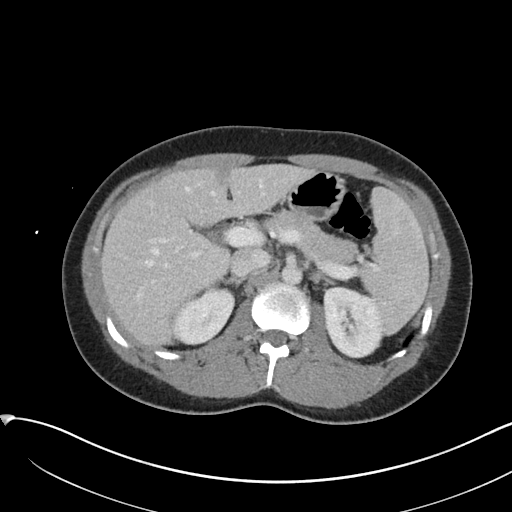
[im 72/91  soft-tissue]
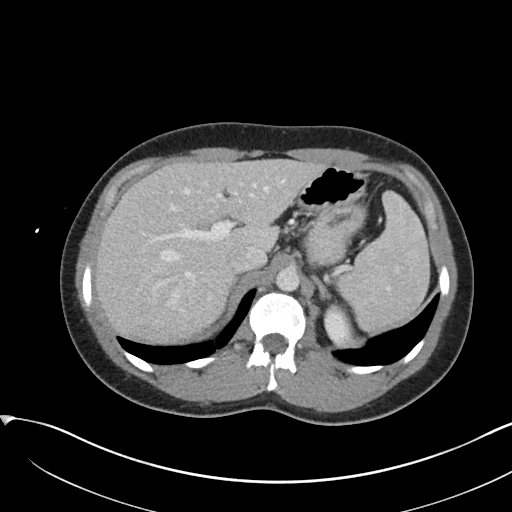
[im 79/91  soft-tissue]
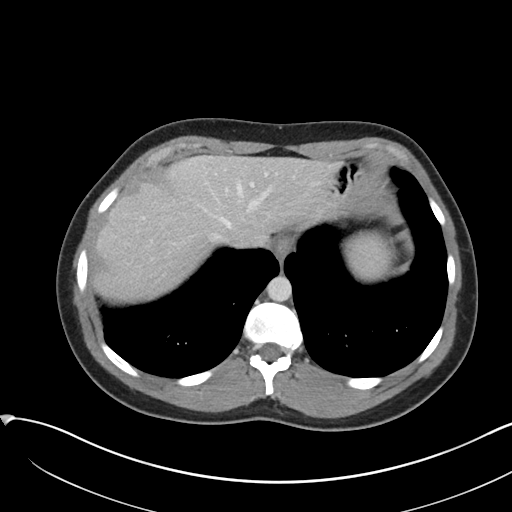
[im 87/91  soft-tissue]
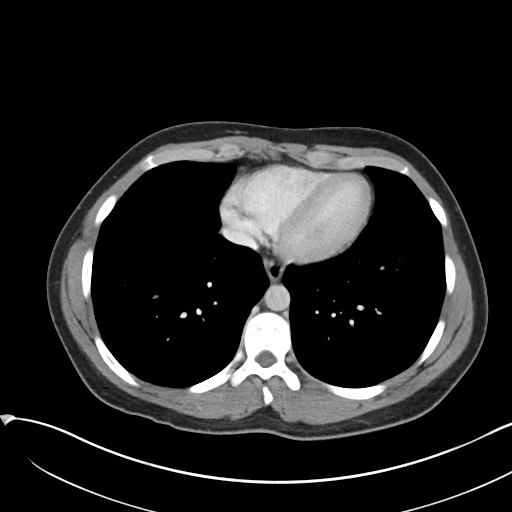

[Series 6: coronal soft tissue · coronal · 0.81mm/px · 3 of 98 slices shown]
[im 33/98  soft-tissue]
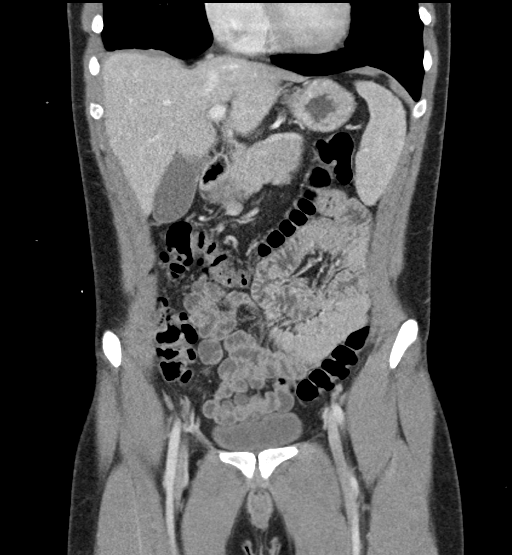
[im 44/98  soft-tissue]
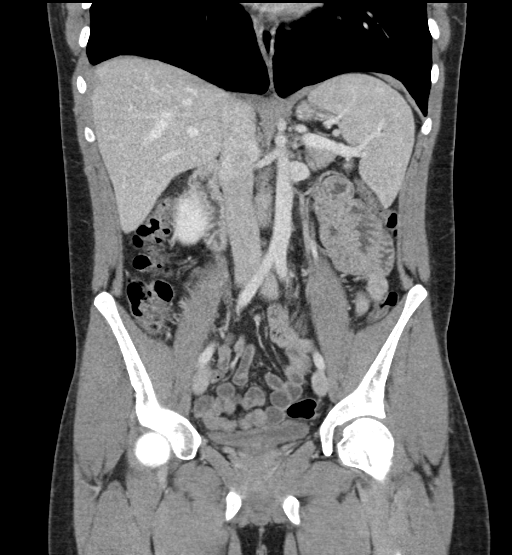
[im 54/98  soft-tissue]
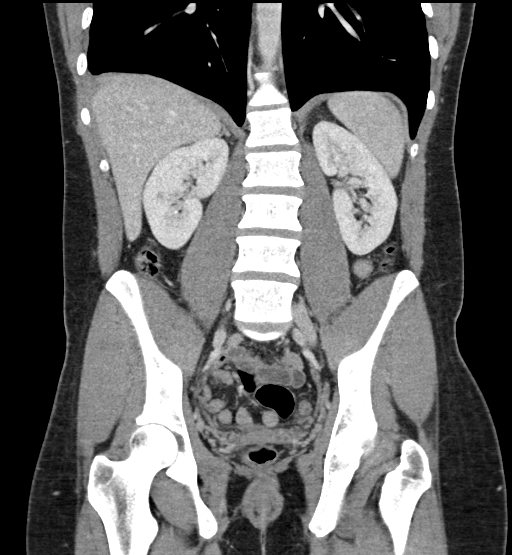

[17 of 46 positions shown; findings below may reference images not displayed]

FINDINGS: Lower chest: No acute abnormality.

Hepatobiliary: No focal liver abnormality is seen. No gallstones,
gallbladder wall thickening, or biliary dilatation.

Pancreas: Unremarkable. No pancreatic ductal dilatation or
surrounding inflammatory changes.

Spleen: Normal in size without focal abnormality.

Adrenals/Urinary Tract: Adrenal glands are unremarkable. Kidneys are
without focal lesion, or hydronephrosis. Tiny nonobstructive renal
calculi seen bilaterally. Bladder is unremarkable.

Stomach/Bowel: Stomach is within normal limits. Post appendectomy.
No evidence of bowel wall thickening, distention, or inflammatory
changes.

Vascular/Lymphatic: No significant vascular findings are present. No
enlarged abdominal or pelvic lymph nodes.

Reproductive: Prostate is unremarkable.

Other: No abdominal wall hernia or abnormality. No abdominopelvic
ascites.

Musculoskeletal: No acute or significant osseous findings.
IMPRESSION: 1. No evidence of acute abnormality within the abdomen or pelvis.
2. Tiny 1-2 mm nonobstructive bilateral renal calculi.

## 2021-01-28 IMAGING — US ULTRASOUND ABDOMEN LIMITED
1 series · 14 of 25 positions shown · non-contrast
Comparison: None available.

CLINICAL DATA: Initial evaluation for acute right upper quadrant
pain for 3 days, nausea, vomiting.

EXAM:
ULTRASOUND ABDOMEN LIMITED RIGHT UPPER QUADRANT

[Series 1: ultrasound abdomen limited · 14 of 49 slices shown]
[im 1/49]
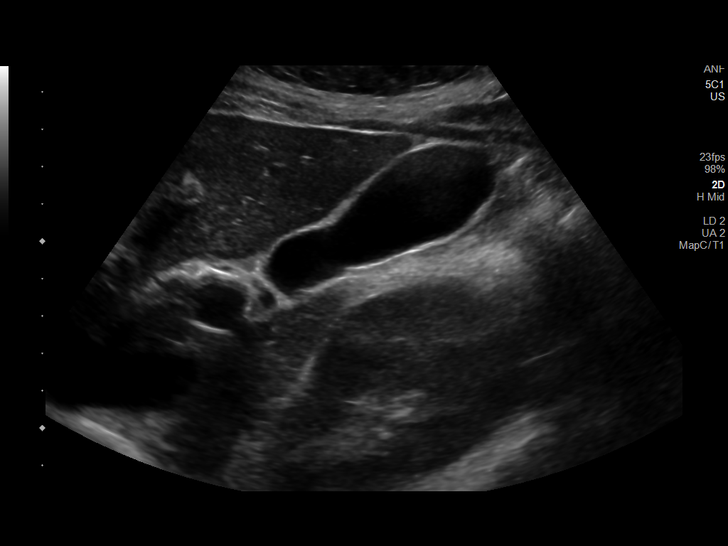
[im 5/49]
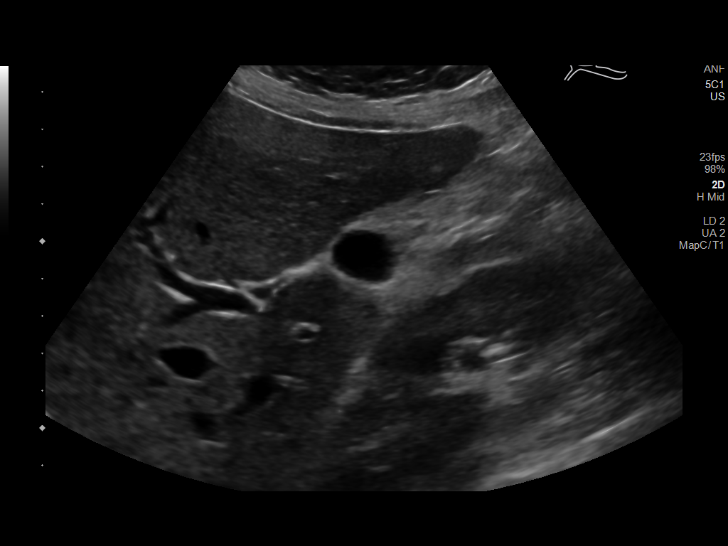
[im 9/49]
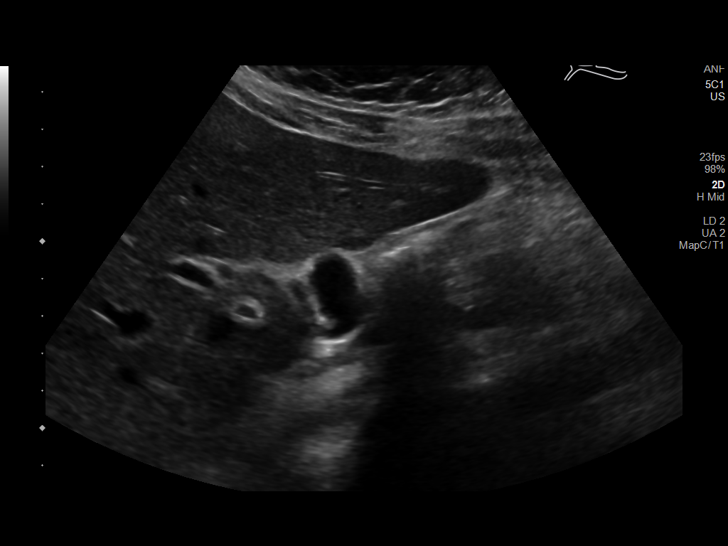
[im 13/49]
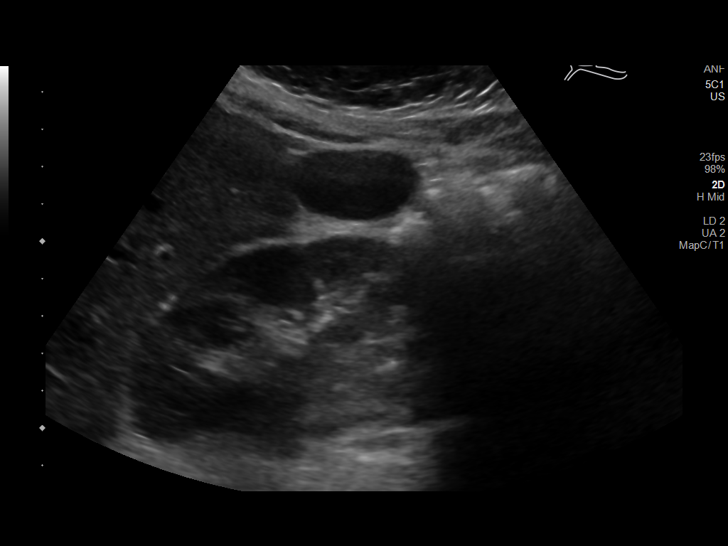
[im 17/49]
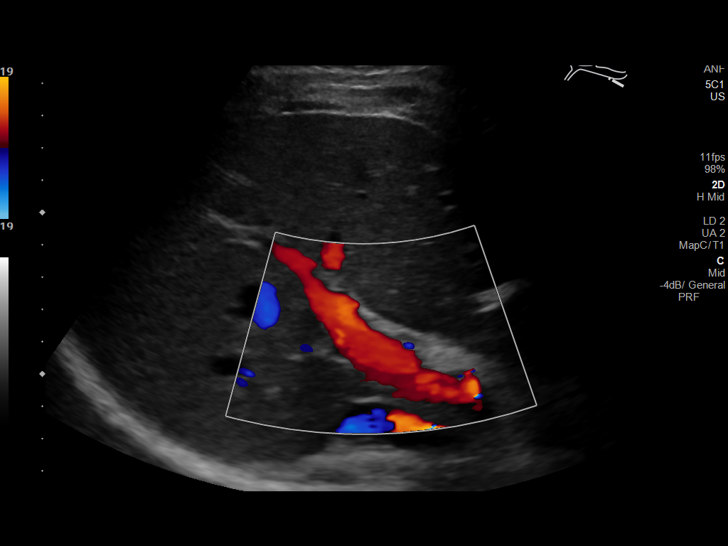
[im 19/49]
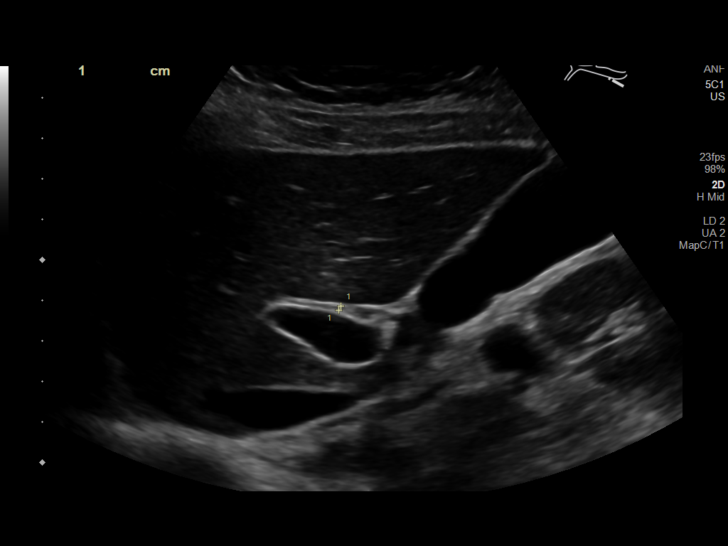
[im 23/49]
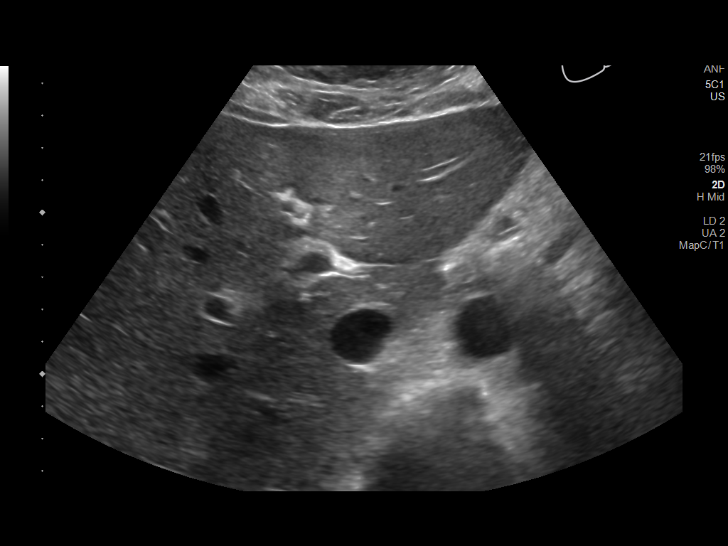
[im 27/49]
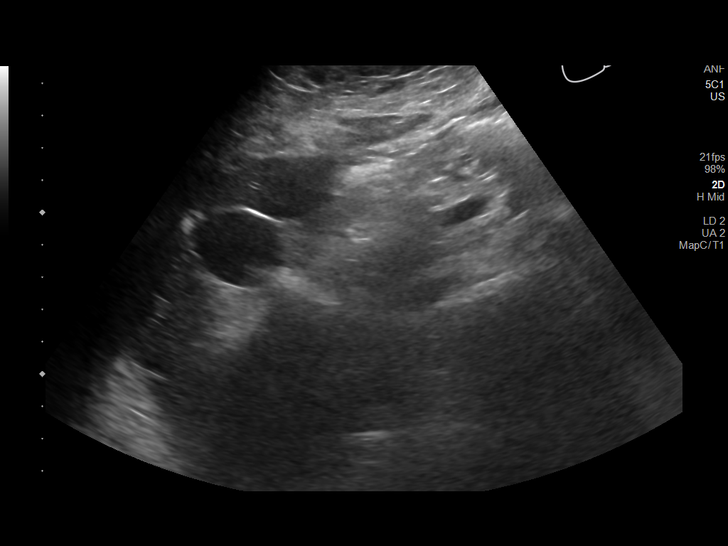
[im 31/49]
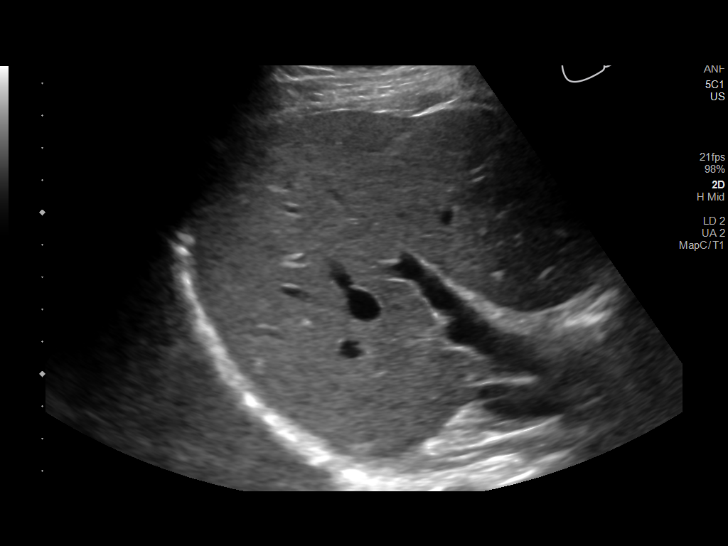
[im 33/49]
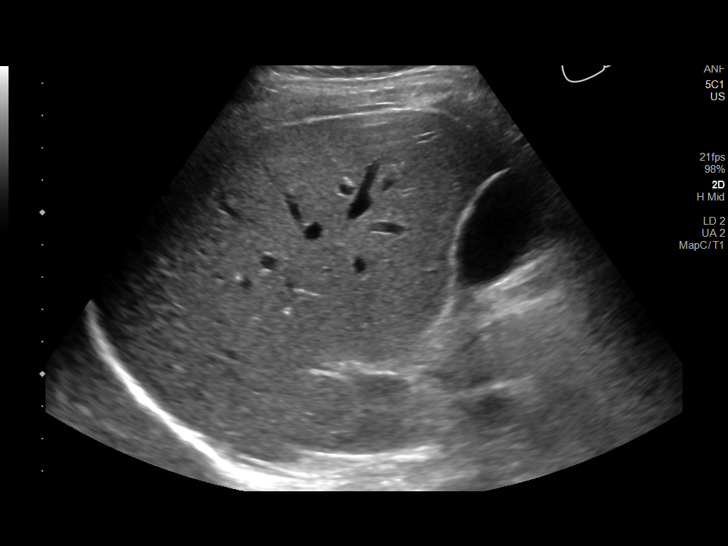
[im 37/49]
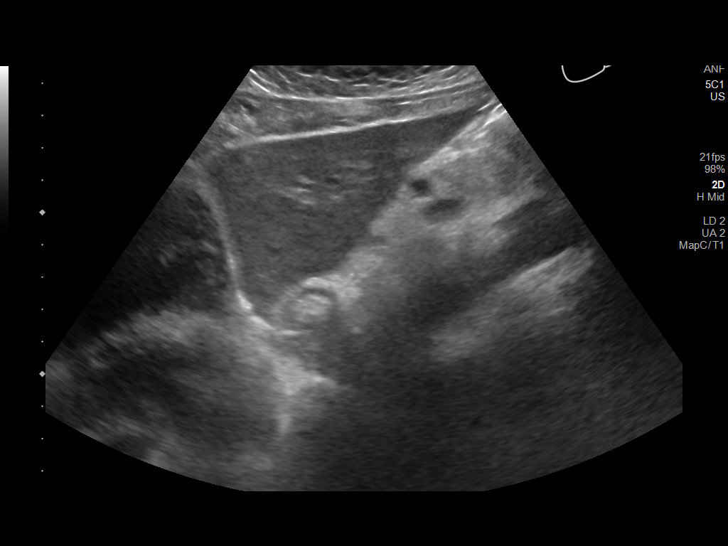
[im 41/49]
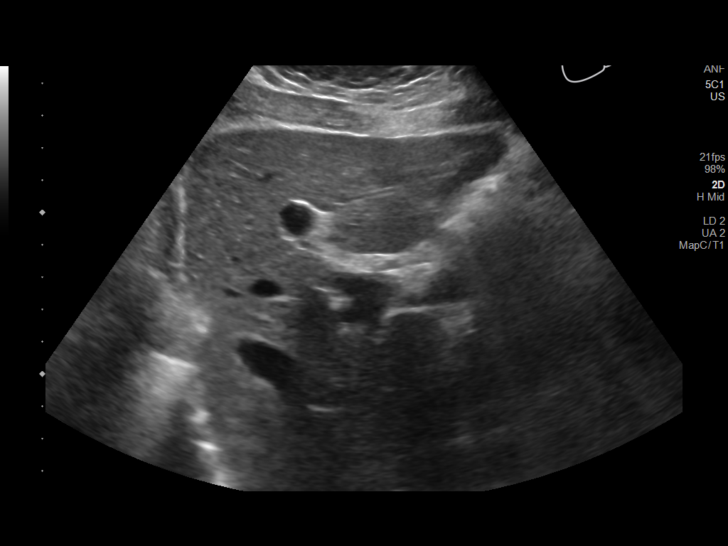
[im 45/49]
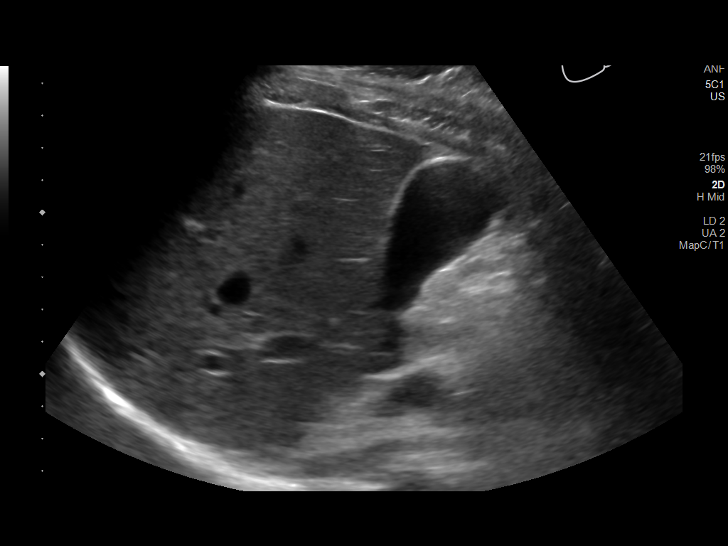
[im 49/49]
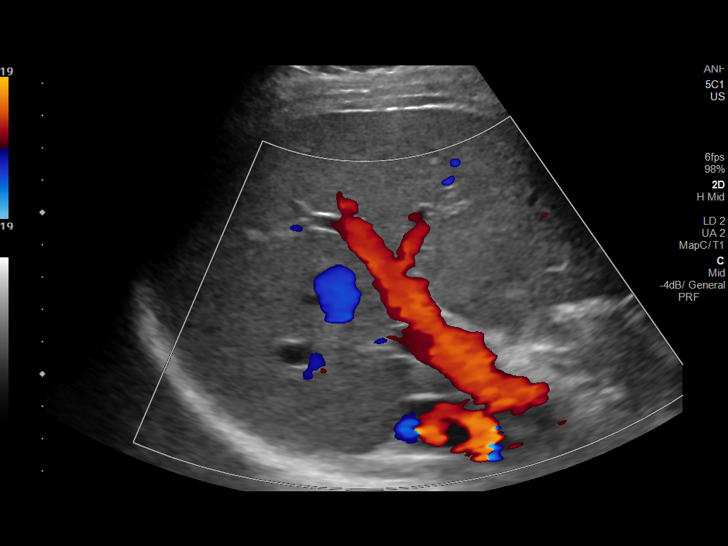

[14 of 25 positions shown; findings below may reference images not displayed]

FINDINGS: Gallbladder:

No gallstones or wall thickening visualized. No sonographic Murphy
sign noted by sonographer.

Common bile duct:

Diameter: 1.2 mm

Liver:

No focal lesion identified. Within normal limits in parenchymal
echogenicity. Portal vein is patent on color Doppler imaging with
normal direction of blood flow towards the liver.
IMPRESSION: Normal right upper quadrant ultrasound.

## 2023-02-27 IMAGING — DX DG CHEST 2V
2 series · 2 of 2 positions shown · non-contrast
Comparison: Chest radiograph 02/15/2013. Lung bases from abdominal
CT 11/02/2018

CLINICAL DATA: Shortness of breath.  Cough fever.

EXAM:
CHEST - 2 VIEW

[chest pa]
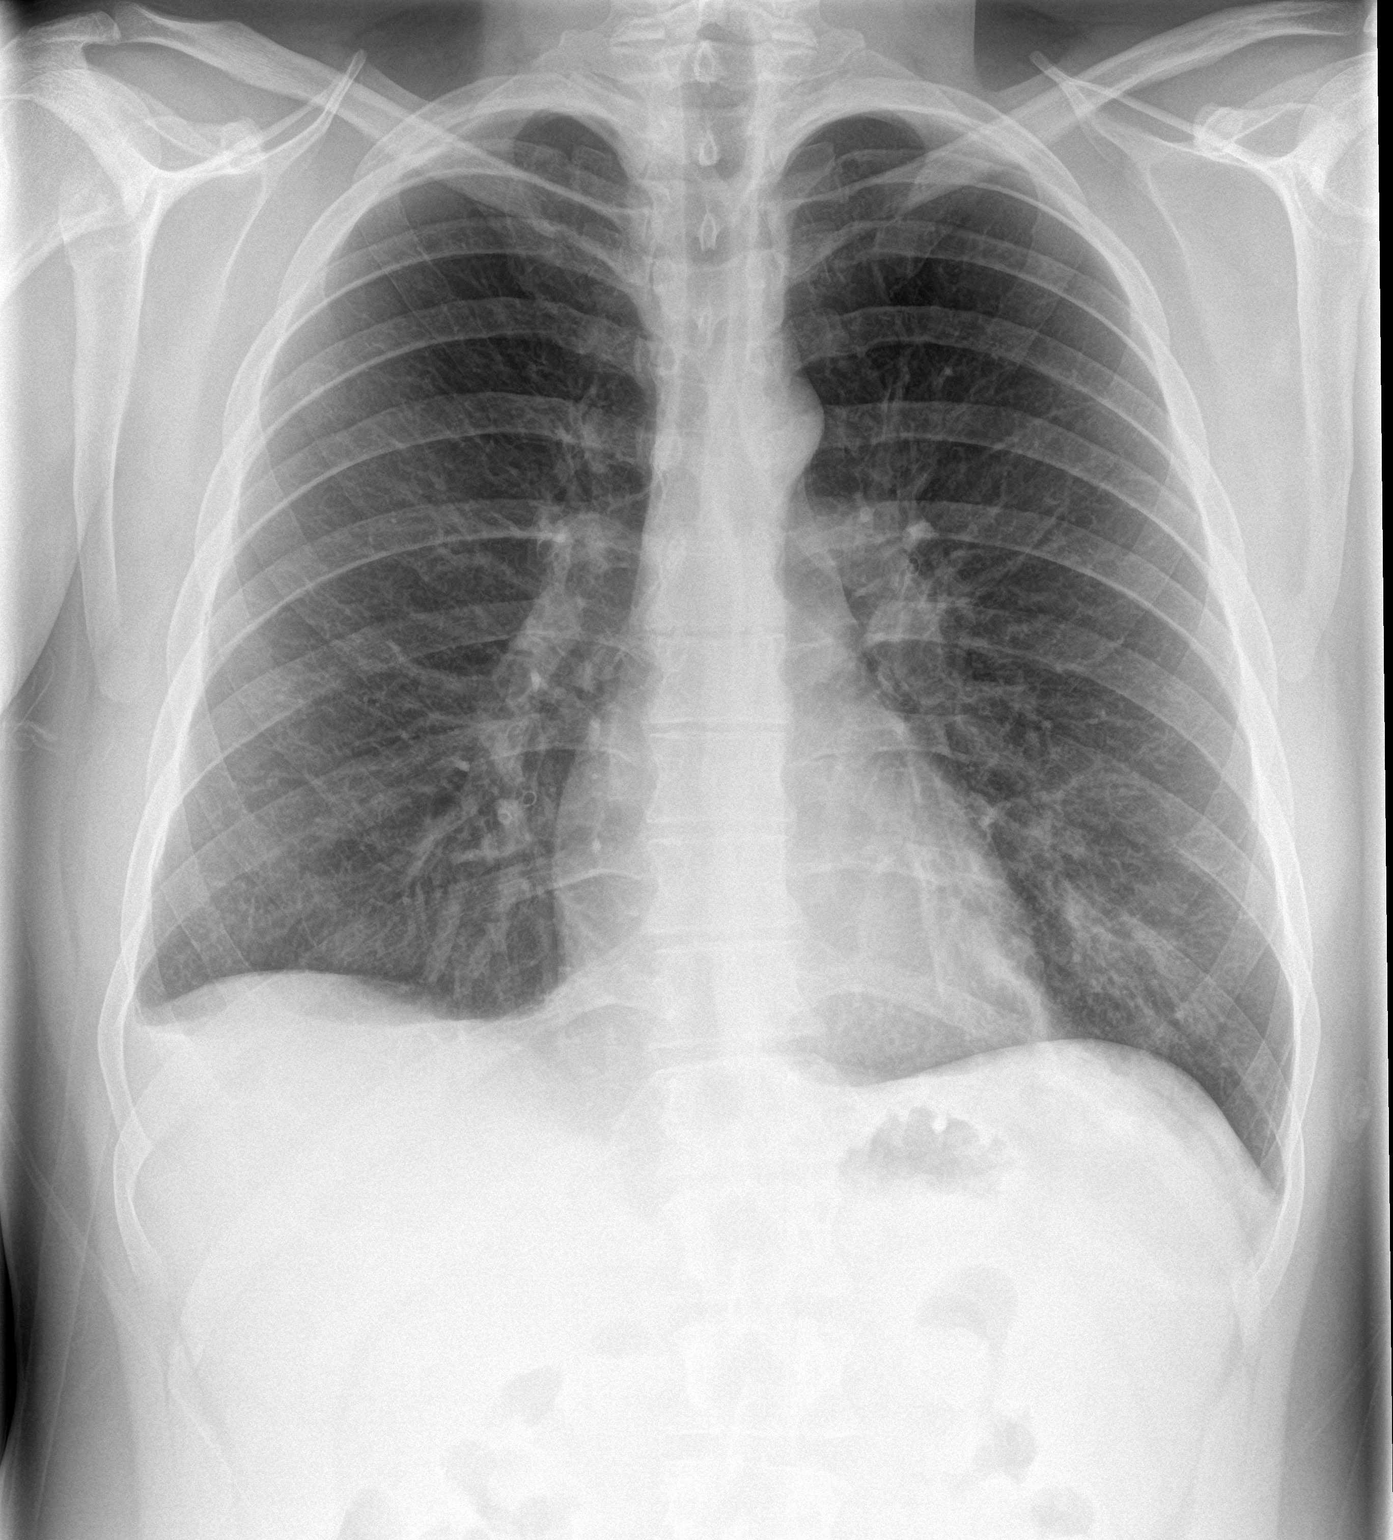

[chest lat]
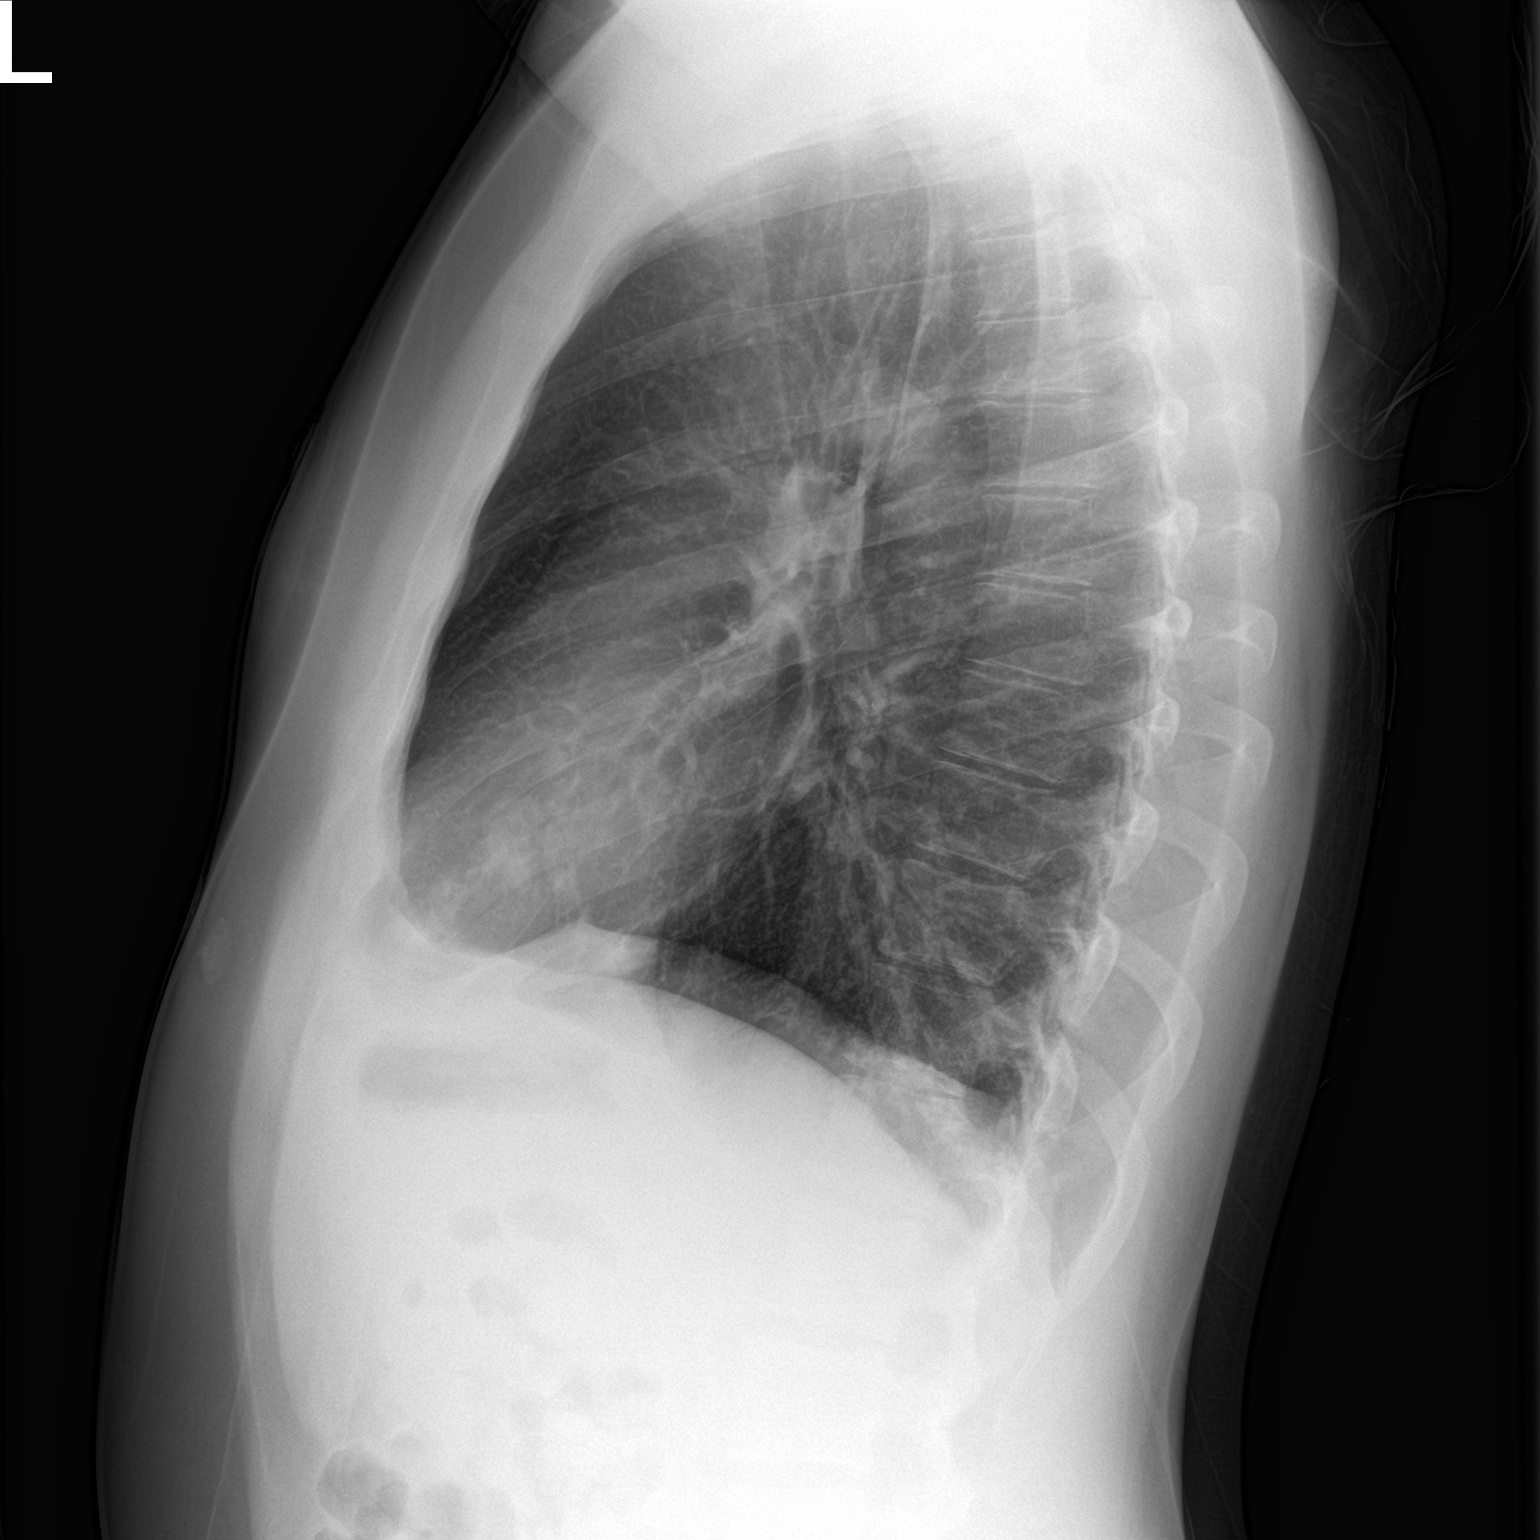

[2 of 2 positions shown; findings below may reference images not displayed]

FINDINGS: The heart is normal in size. Normal mediastinal contours. There is
patchy airspace disease within the lingula. Chronic blunting of the
costophrenic angle may be related to scarring, unchanged from prior
exams. No significant effusion. No pulmonary edema. No pneumothorax.
No acute osseous abnormalities are seen.
IMPRESSION: Patchy airspace disease within the lingula, suspicious for
pneumonia.
